# Patient Record
Sex: Female | Born: 1975 | Race: White | Hispanic: No | Marital: Married | Smoking: Former smoker
Health system: Southern US, Community
[De-identification: ages and names within clinical notes are randomized; demographics above are authoritative.]

## PROBLEM LIST (undated history)

## (undated) ENCOUNTER — Inpatient Hospital Stay (HOSPITAL_COMMUNITY): Payer: Self-pay

## (undated) DIAGNOSIS — O09529 Supervision of elderly multigravida, unspecified trimester: Secondary | ICD-10-CM

## (undated) DIAGNOSIS — Z8619 Personal history of other infectious and parasitic diseases: Secondary | ICD-10-CM

## (undated) DIAGNOSIS — IMO0002 Reserved for concepts with insufficient information to code with codable children: Secondary | ICD-10-CM

## (undated) DIAGNOSIS — F419 Anxiety disorder, unspecified: Secondary | ICD-10-CM

## (undated) HISTORY — PX: MOUTH SURGERY: SHX715

## (undated) HISTORY — DX: Supervision of elderly multigravida, unspecified trimester: O09.529

## (undated) HISTORY — DX: Anxiety disorder, unspecified: F41.9

## (undated) HISTORY — DX: Reserved for concepts with insufficient information to code with codable children: IMO0002

## (undated) HISTORY — DX: Personal history of other infectious and parasitic diseases: Z86.19

---

## 2001-10-14 HISTORY — PX: BREAST SURGERY: SHX581

## 2002-01-01 ENCOUNTER — Emergency Department (HOSPITAL_COMMUNITY): Admission: EM | Admit: 2002-01-01 | Discharge: 2002-01-01 | Payer: Self-pay

## 2002-02-09 ENCOUNTER — Encounter: Admission: RE | Admit: 2002-02-09 | Discharge: 2002-02-09 | Payer: Self-pay

## 2003-08-23 ENCOUNTER — Ambulatory Visit (HOSPITAL_COMMUNITY): Admission: RE | Admit: 2003-08-23 | Discharge: 2003-08-23 | Payer: Self-pay | Admitting: *Deleted

## 2003-09-06 ENCOUNTER — Ambulatory Visit (HOSPITAL_COMMUNITY): Admission: RE | Admit: 2003-09-06 | Discharge: 2003-09-06 | Payer: Self-pay | Admitting: *Deleted

## 2003-09-15 ENCOUNTER — Encounter: Admission: RE | Admit: 2003-09-15 | Discharge: 2003-09-15 | Payer: Self-pay | Admitting: *Deleted

## 2003-09-28 ENCOUNTER — Ambulatory Visit (HOSPITAL_COMMUNITY): Admission: RE | Admit: 2003-09-28 | Discharge: 2003-09-28 | Payer: Self-pay | Admitting: Obstetrics and Gynecology

## 2003-09-28 ENCOUNTER — Encounter: Admission: RE | Admit: 2003-09-28 | Discharge: 2003-09-28 | Payer: Self-pay | Admitting: *Deleted

## 2003-10-12 ENCOUNTER — Encounter (INDEPENDENT_AMBULATORY_CARE_PROVIDER_SITE_OTHER): Payer: Self-pay | Admitting: Specialist

## 2003-10-12 ENCOUNTER — Observation Stay (HOSPITAL_COMMUNITY): Admission: AD | Admit: 2003-10-12 | Discharge: 2003-10-13 | Payer: Self-pay | Admitting: *Deleted

## 2004-09-27 IMAGING — US US OB DETAIL+14 WK
1 series · 13 of 17 positions shown · non-contrast
Comparison: none

CLINICAL DATA: 27 year-old female with risk for neural tube defect.  Approximately 16 week gestation.
OBSTETRICAL ULTRASOUND DETAILED WITH ENDOVAGINAL EVALUATION
NUMBER OF FETUSES:  1
HEART RATE:  155 bpm
MOVEMENT:  Yes
BREATHING:  Yes
PRESENTATION:  Breech
PLACENTAL LOCATION:  Anterior, low lying
AMNIOTIC FLUID (SUBJECTIVE):  Normal

[Series 1: unknown · 0.17mm/px · 13 of 17 slices shown]
[im 1/17]
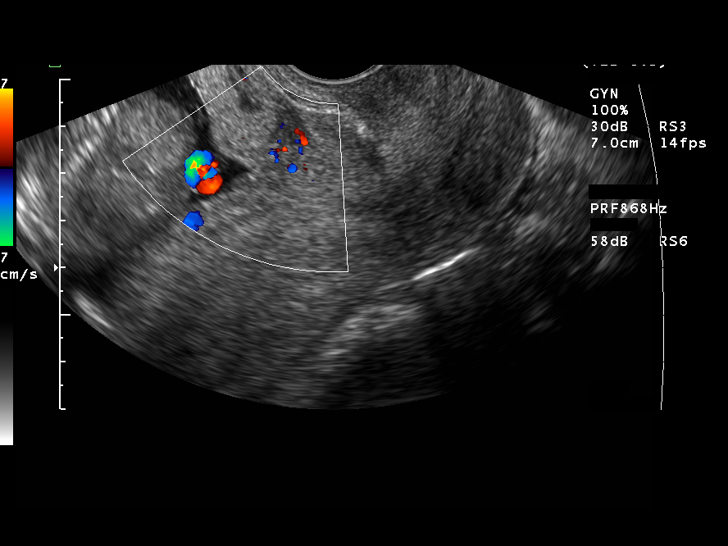
[im 2/17]
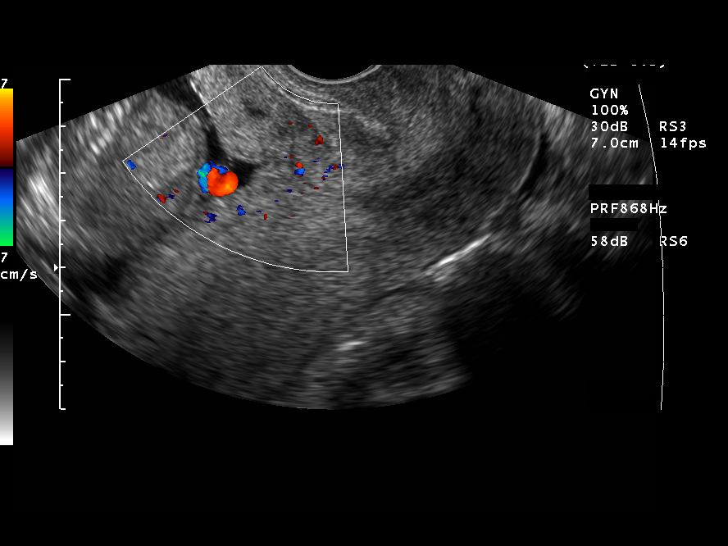
[im 4/17]
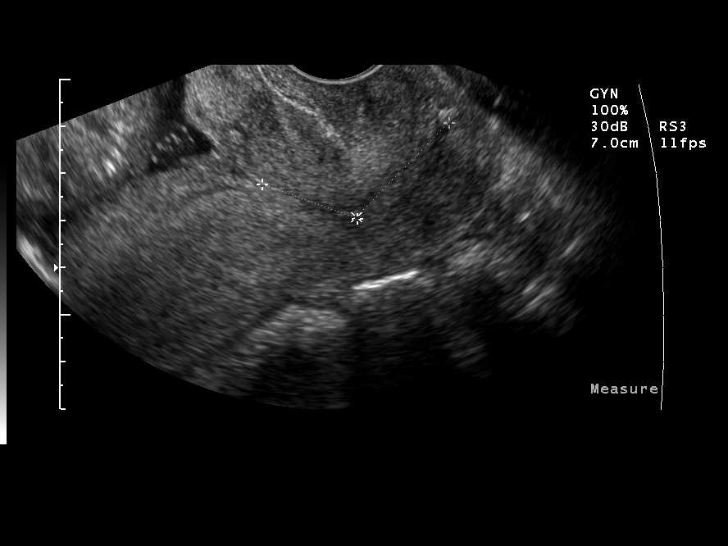
[im 5/17]
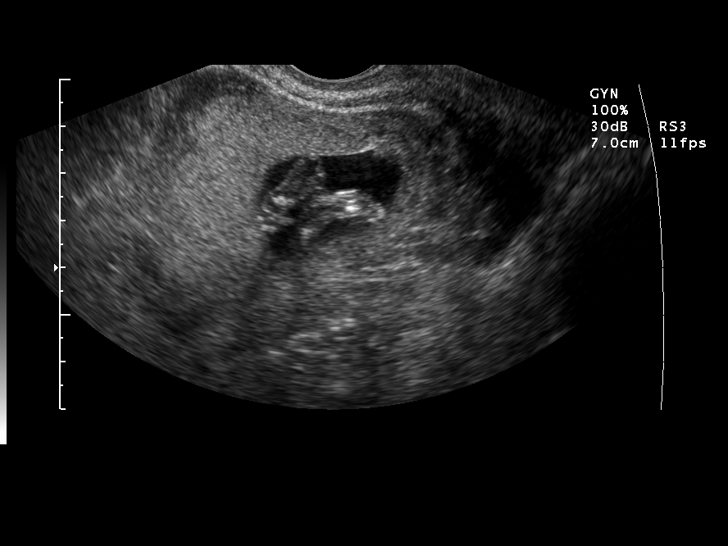
[im 6/17]
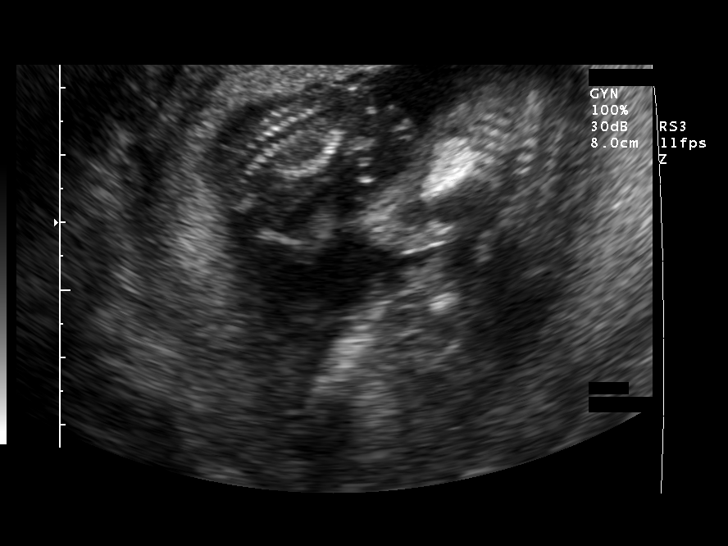
[im 8/17]
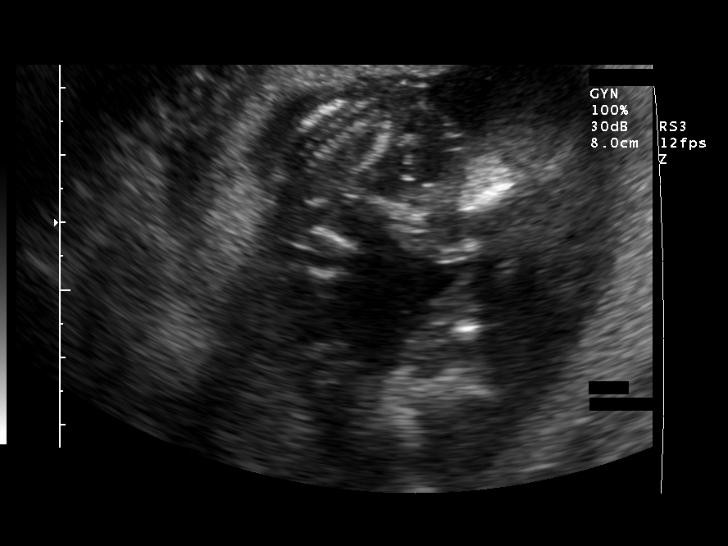
[im 9/17]
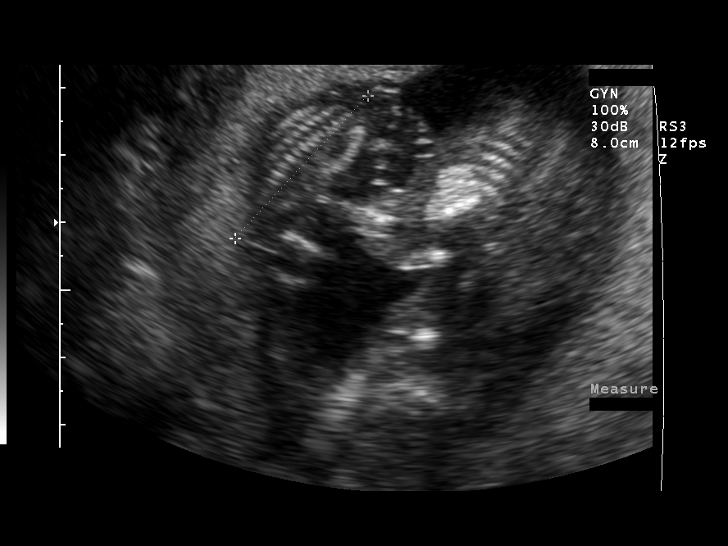
[im 10/17]
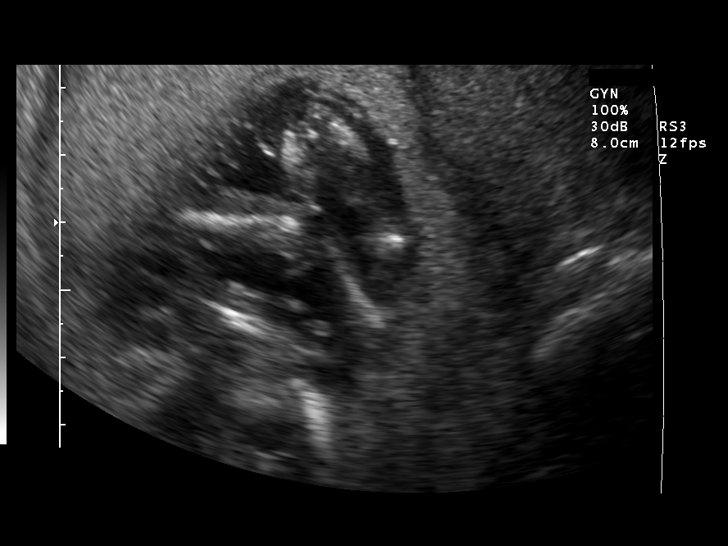
[im 12/17]
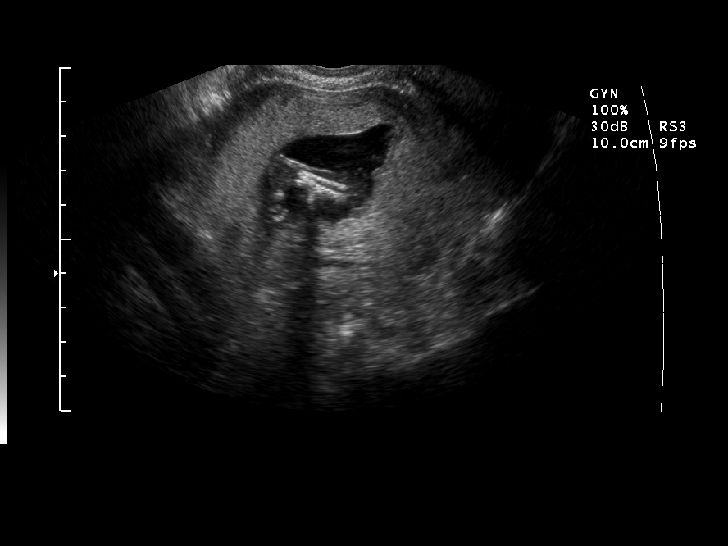
[im 13/17]
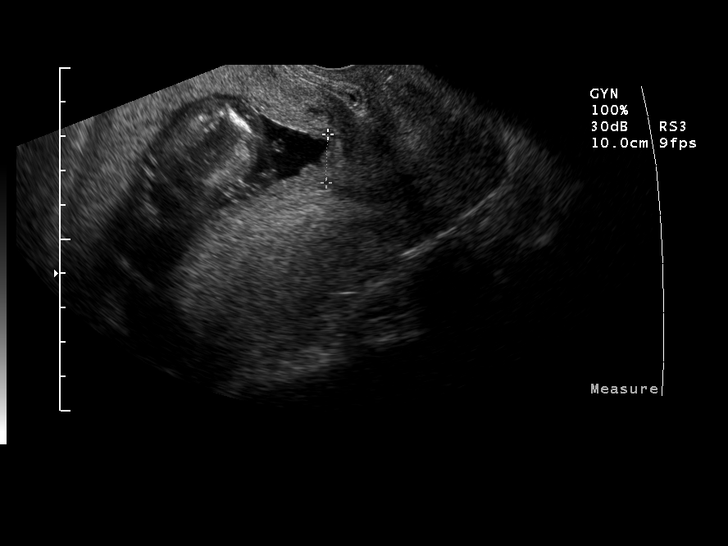
[im 14/17]
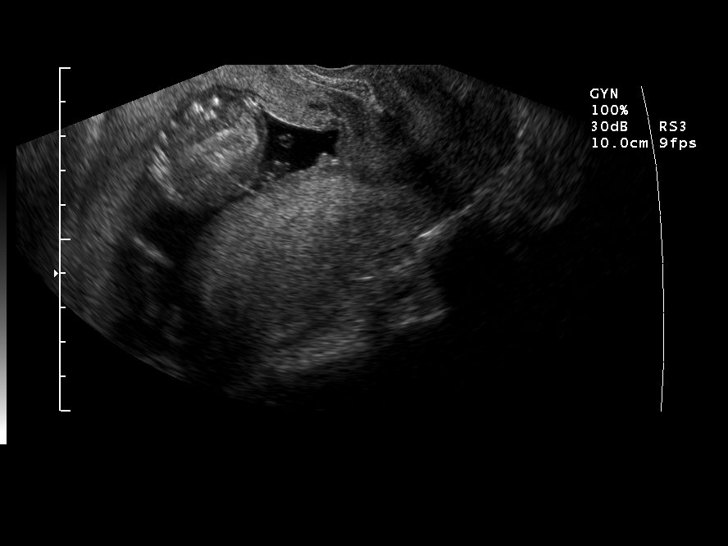
[im 16/17]
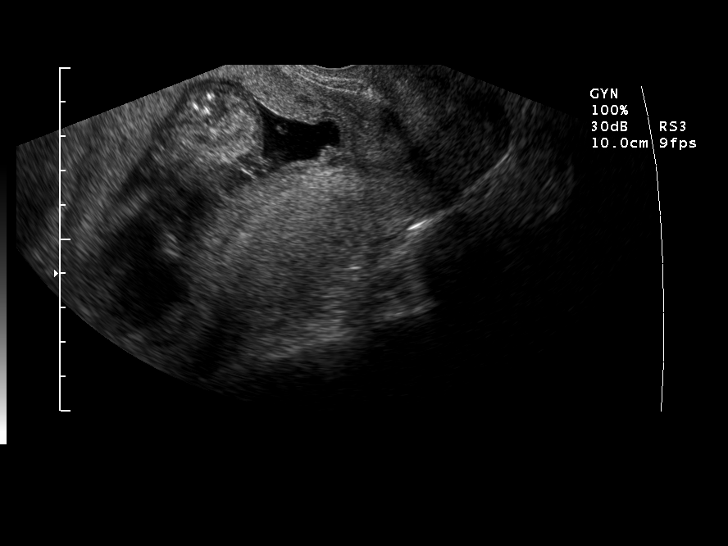
[im 17/17]
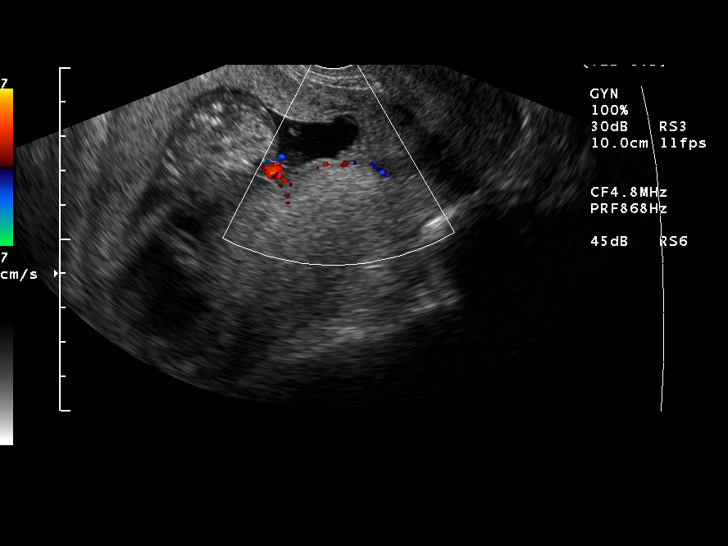

[13 of 17 positions shown; findings below may reference images not displayed]

FETAL BIOMETRY
BPD:  3.0 cm  15W  4D
HC:  11.8 cm  15W  6D
AC:  11.0 cm  16W  6D
FL:  2.0 cm  15W  6D
MEAN GA:  16W  0D

FETAL ANATOMY
LATERAL VENTRICLES:  visualized 
THALAMI/CPS:  visualized 
POSTERIOR FOSSA:  visualized 
NUCHAL REGION:  visualized 
SPINE:  visualized 
4 CHAMBER HEART ON LEFT:  visualized 
STOMACH ON LEFT:  visualized 
3 VESSEL CORD:  visualized 
CORD INSERTION SITE:  visualized 
KIDNEYS:  visualized 
BLADDER:  visualized 
EXTREMITIES:  visualized 

ADDITIONAL ANATOMY VISUALIZED:  LVOT, RVOT, upper lip, ductal arch, aortic arch.

EVALUATION LIMITED BY:  Early gestational age. 

MATERNAL FINDINGS
CERVIX:   3.9 cm transvaginally.
IMPRESSION 
Monoamniotic twin pregnancy with the larger of the two twins having a mean gestational age of 16 weeks 0 days by fetal biometry.  The second, much smaller, twin has an identifiable abdomen and lower extremities with a probable partial lower thorax.  No cardiac activity or movement was identified in the small twin.  No calvarium could be identified in the small fetus.  
Despite concerted effort, Doppler flow in the smaller twin could not be definitely appreciated.  The constellation of findings today is worrisome for acardiac twinning.
There is a question of echogenic bowel in the larger twin, a finding which could not be reliably reproduced.   The remaining visualized anatomy in the larger twin is unremarkable.   These results were discussed with Doctor Henok at the time of the examination.

## 2004-11-16 IMAGING — US US OB LIMITED
1 series · 12 of 12 positions shown · non-contrast
Comparison: none

CLINICAL DATA: Follow-up Monoamniotic twin gestation with acardiac twin.  Decreased fetal movement.

[Series 1: unknown · 0.28mm/px · 12 of 12 slices shown]
[im 1/12]
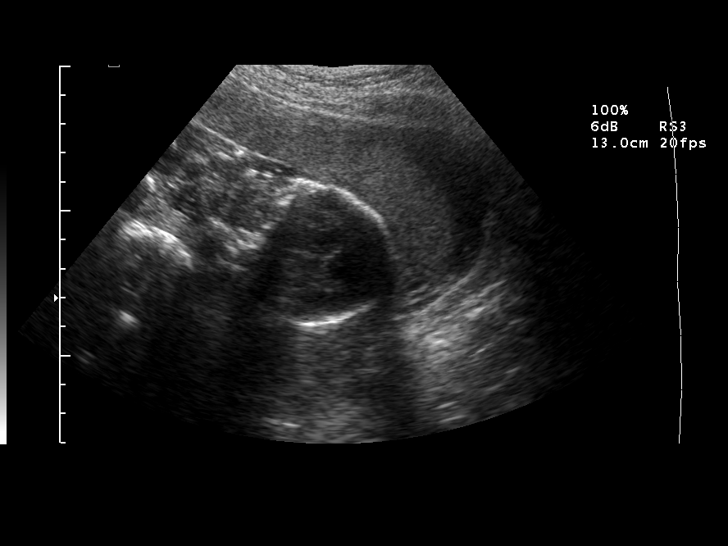
[im 2/12]
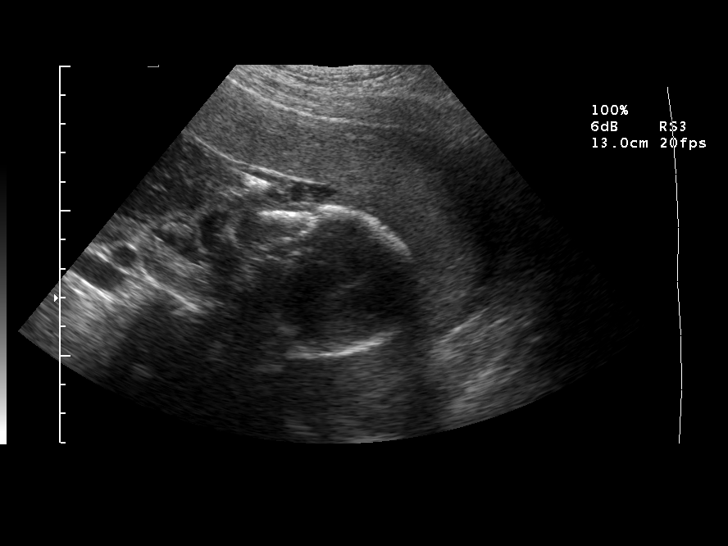
[im 3/12]
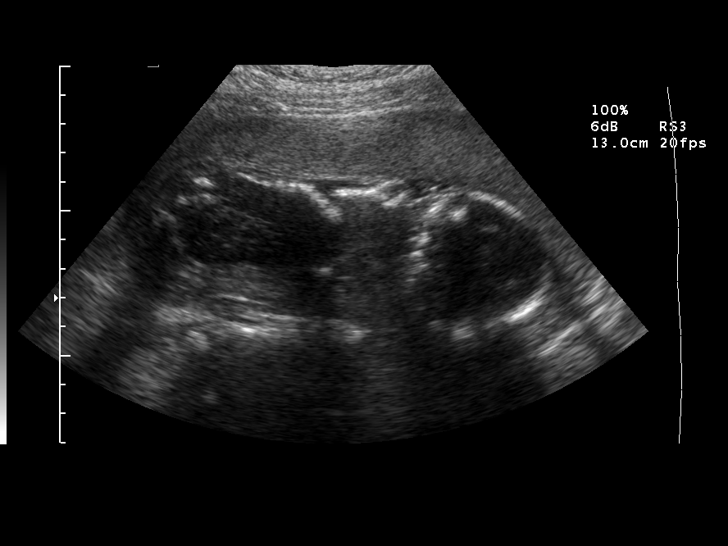
[im 4/12]
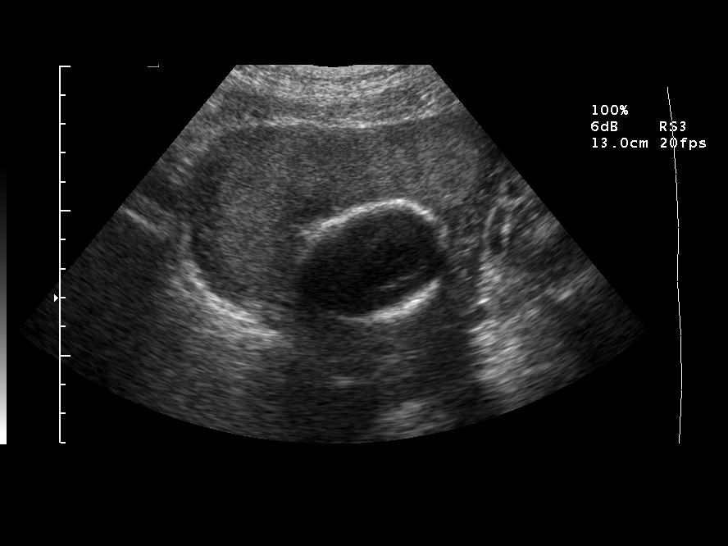
[im 5/12]
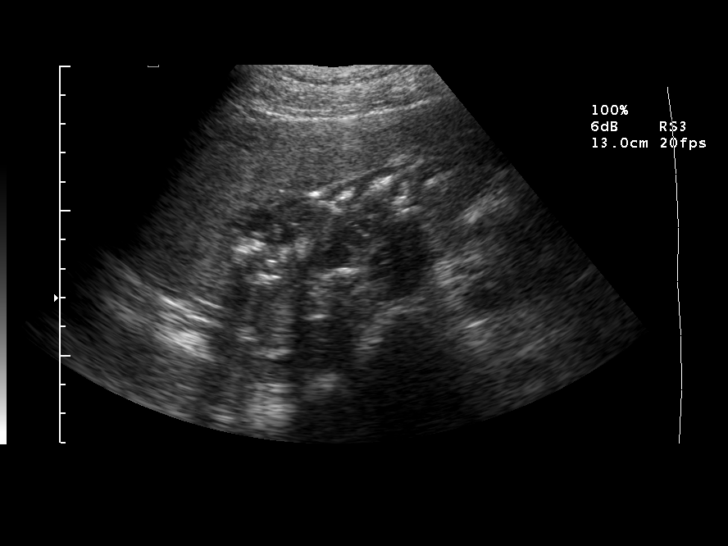
[im 6/12]
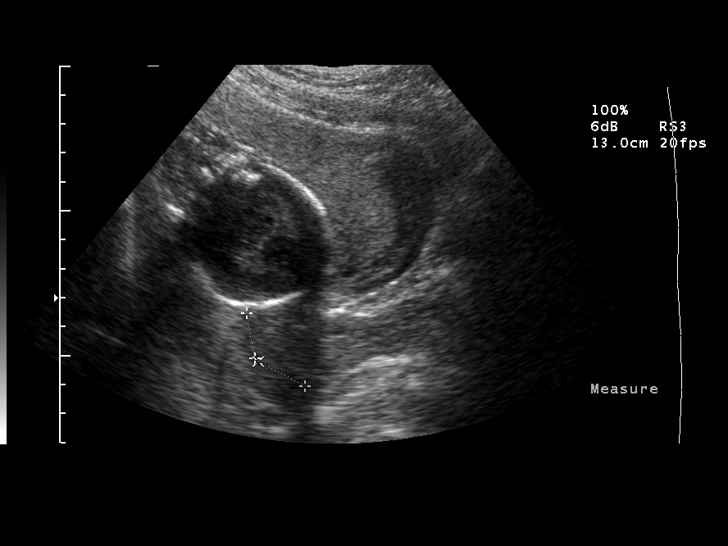
[im 7/12]
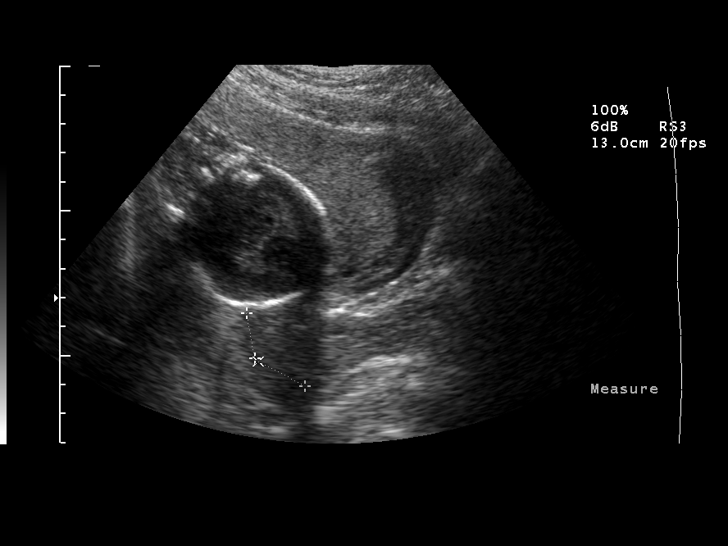
[im 8/12]
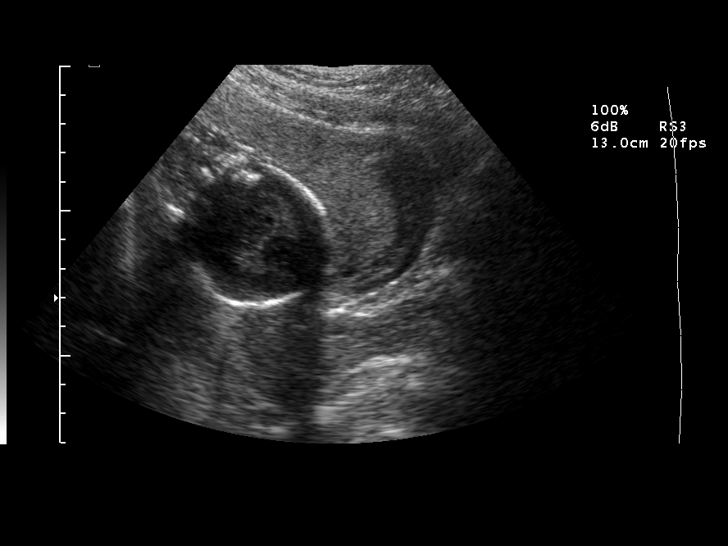
[im 9/12]
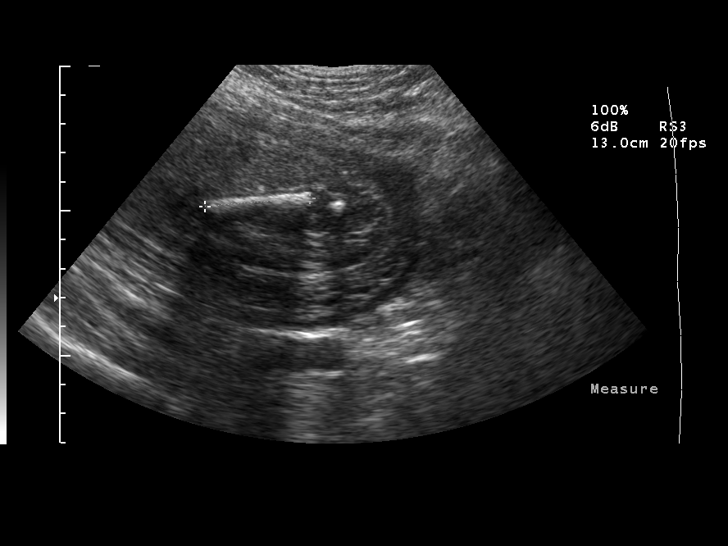
[im 10/12]
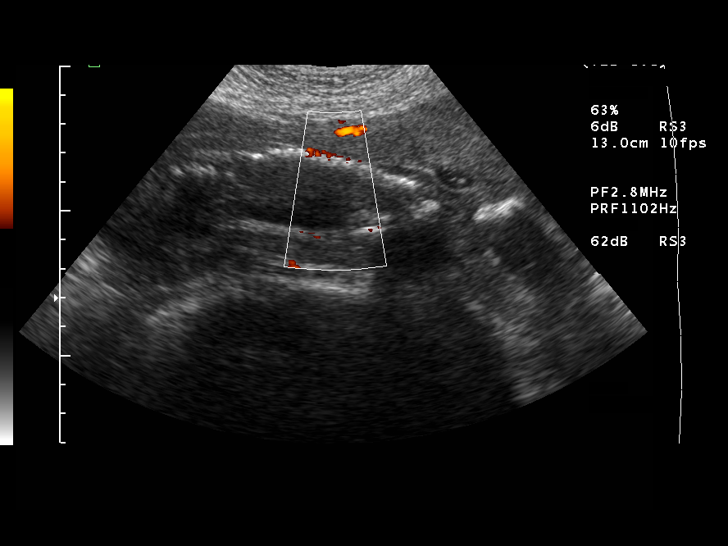
[im 11/12]
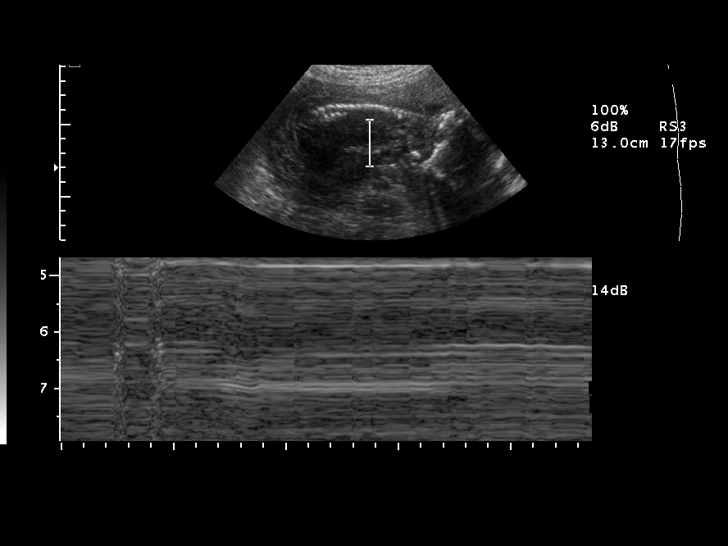
[im 12/12]
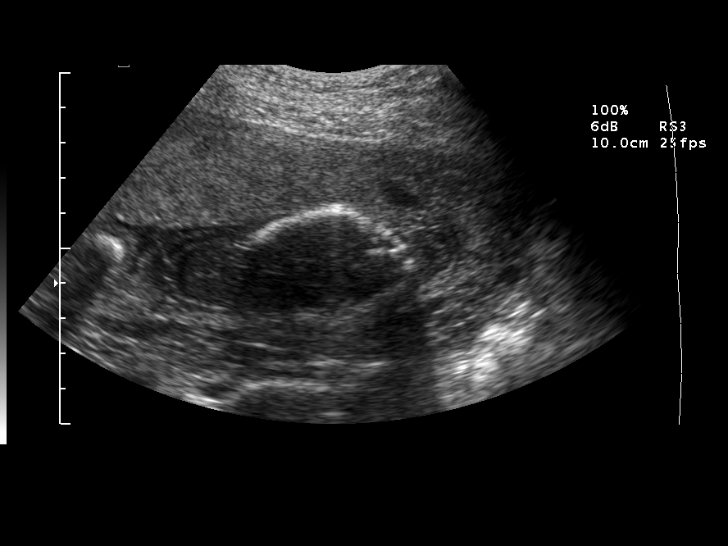

[12 of 12 positions shown; findings below may reference images not displayed]

LIMITED OBSTETRICAL ULTRASOUND
Comparison is made with previous study on 09/28/03.
An intrauterine fetus is seen on today?s study in cephalic presentation.  Femur length measures 3.6 cm, consistent with a gestational age of 21 weeks 4 days.  There is no fetal cardiac activity on this study, consistent with intrauterine fetal demise which has occurred since the prior study on 09/28/03.  No amniotic fluid is present and the previously seen small acardiac twin is not visualized on today?s study.  
IMPRESSION
Intrauterine fetal demise, as described above.  Longeyareeo?Solt study measures 21 weeks 4 days.  These findings were discussed with the patient and Dr. Ramones.

## 2005-02-08 ENCOUNTER — Other Ambulatory Visit: Admission: RE | Admit: 2005-02-08 | Discharge: 2005-02-08 | Payer: Self-pay | Admitting: Obstetrics and Gynecology

## 2005-05-03 ENCOUNTER — Inpatient Hospital Stay (HOSPITAL_COMMUNITY): Admission: AD | Admit: 2005-05-03 | Discharge: 2005-05-03 | Payer: Self-pay | Admitting: Obstetrics and Gynecology

## 2005-09-02 ENCOUNTER — Inpatient Hospital Stay (HOSPITAL_COMMUNITY): Admission: AD | Admit: 2005-09-02 | Discharge: 2005-09-02 | Payer: Self-pay | Admitting: Obstetrics and Gynecology

## 2005-09-03 ENCOUNTER — Inpatient Hospital Stay (HOSPITAL_COMMUNITY): Admission: AD | Admit: 2005-09-03 | Discharge: 2005-09-05 | Payer: Self-pay | Admitting: Obstetrics and Gynecology

## 2005-10-03 ENCOUNTER — Other Ambulatory Visit: Admission: RE | Admit: 2005-10-03 | Discharge: 2005-10-03 | Payer: Self-pay | Admitting: Obstetrics and Gynecology

## 2006-06-08 IMAGING — US US OB LIMITED
1 series · 14 of 28 positions shown · non-contrast
Comparison: none

05/08/05 ? DUPLICATE COPY for exam association in [facility] ? No change from original report.
CLINICAL DATA: 22 weeks pregnant, low amniotic fluid volume, question premature rupture of membranes.

[Series 1: us ob limited · 0.24mm/px · 14 of 36 slices shown]
[im 2/36]
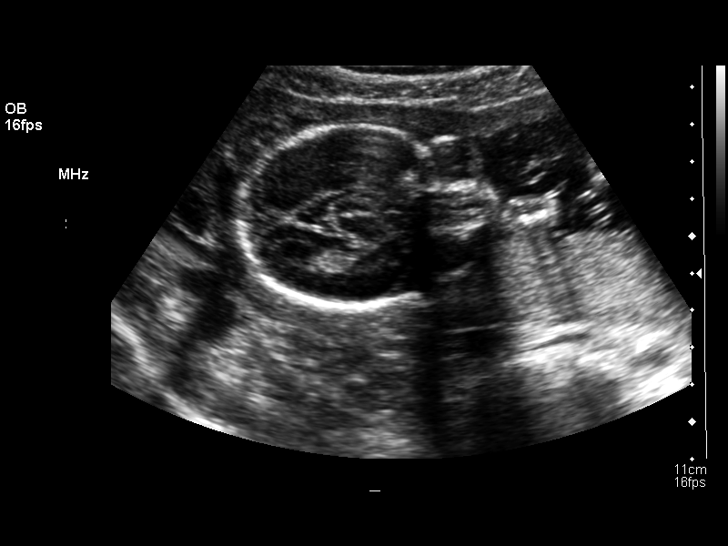
[im 4/36]
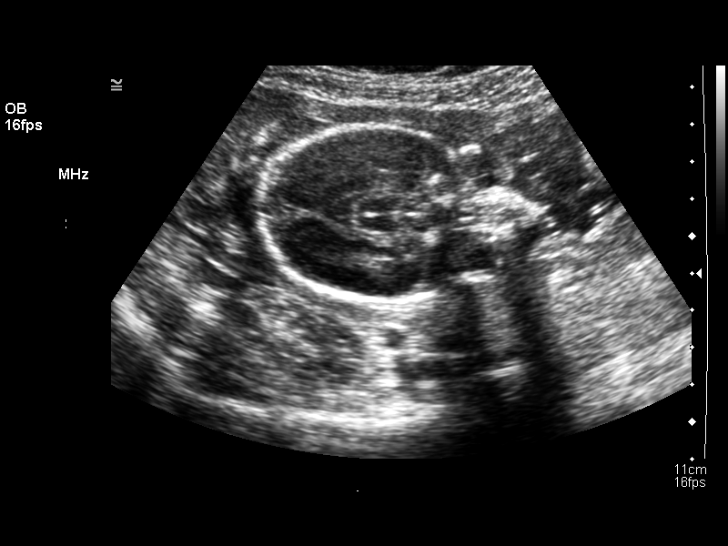
[im 7/36]
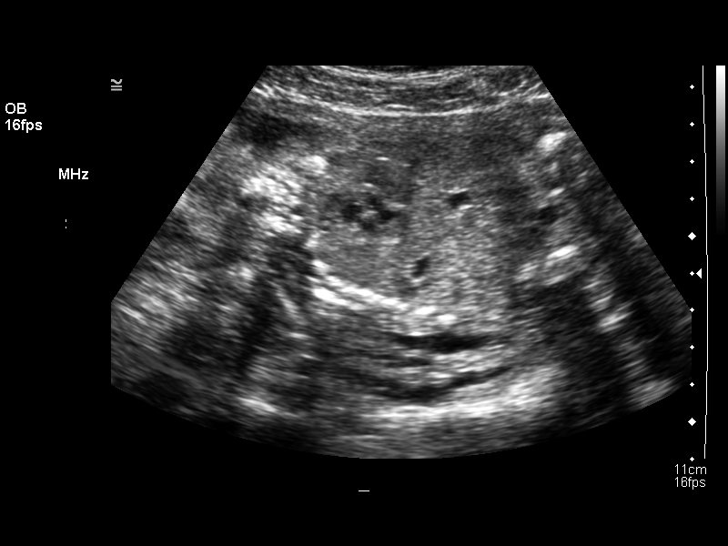
[im 10/36]
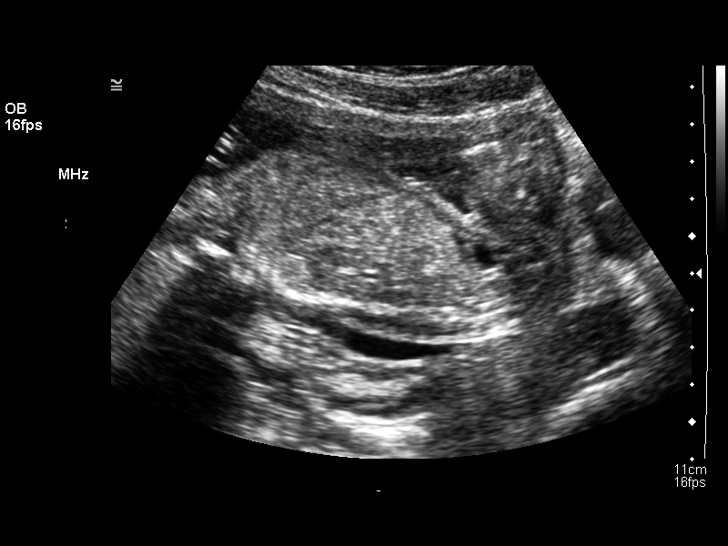
[im 12/36]
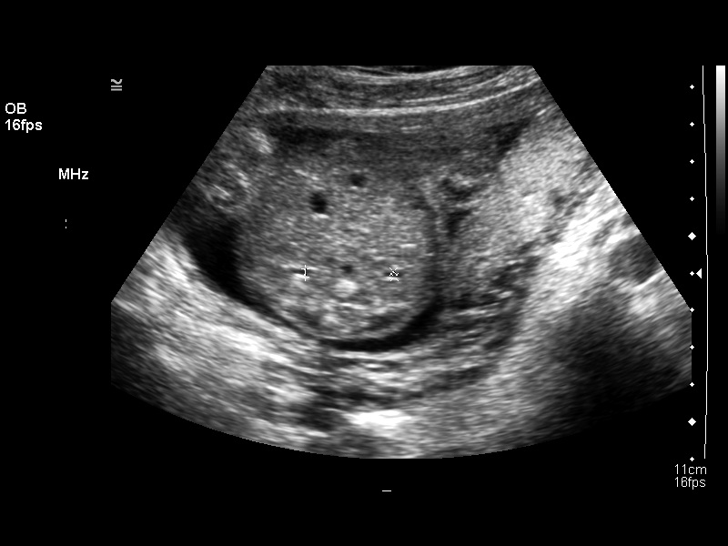
[im 15/36]
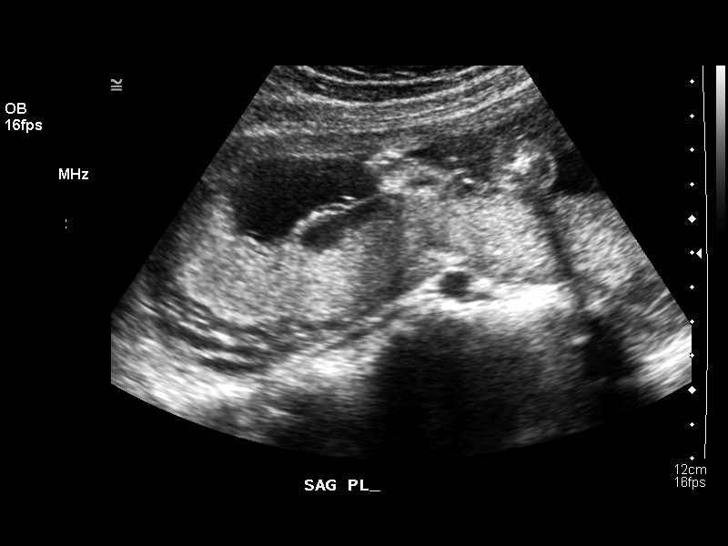
[im 17/36]
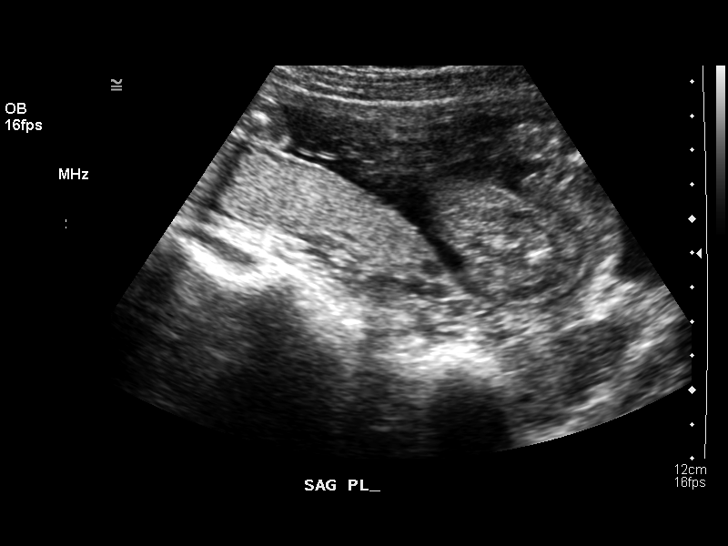
[im 20/36]
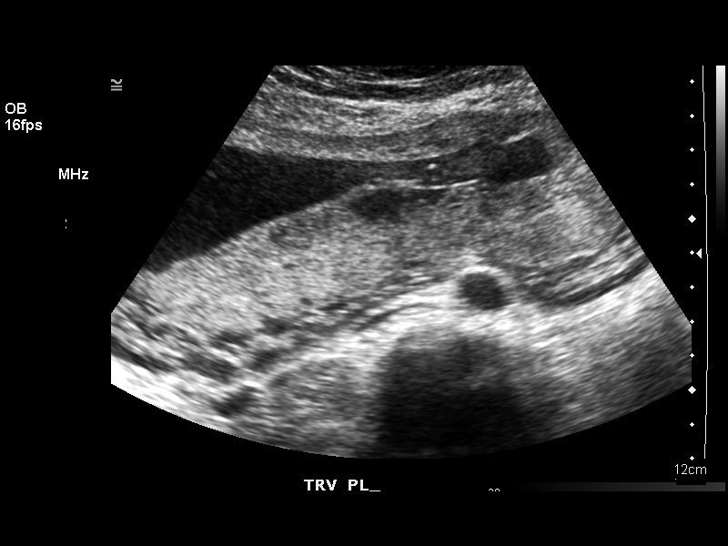
[im 23/36]
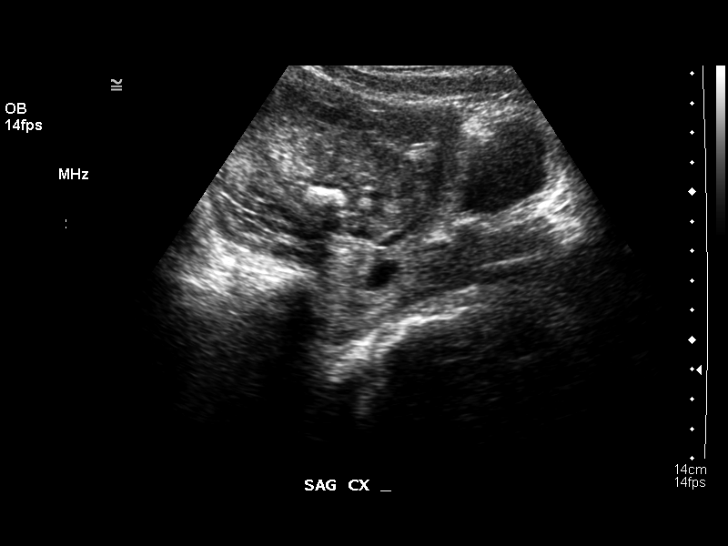
[im 25/36]
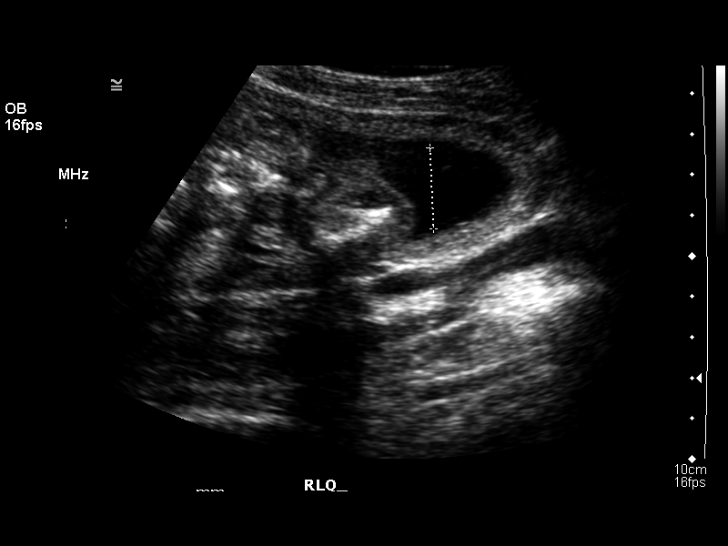
[im 28/36]
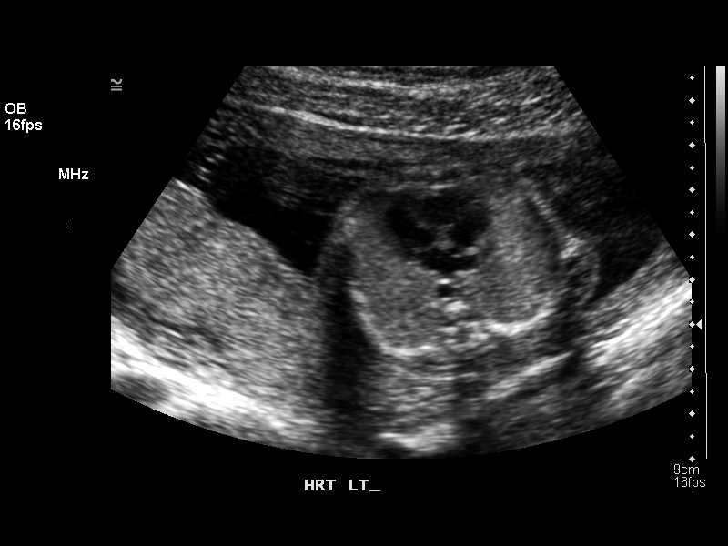
[im 30/36]
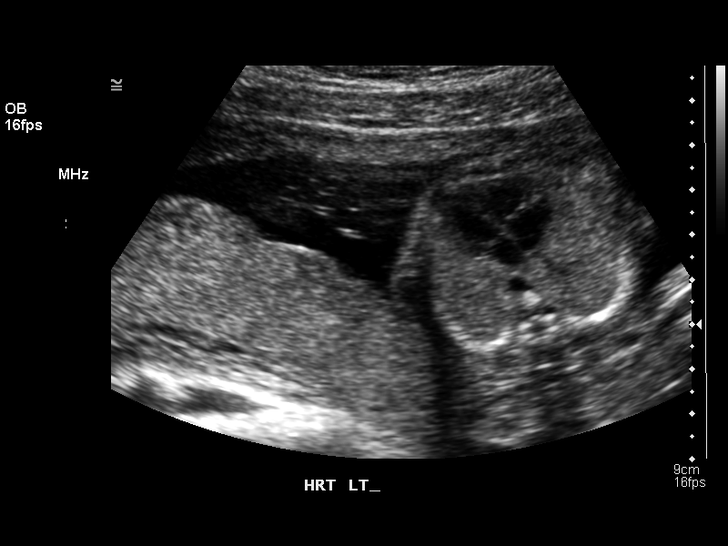
[im 33/36]
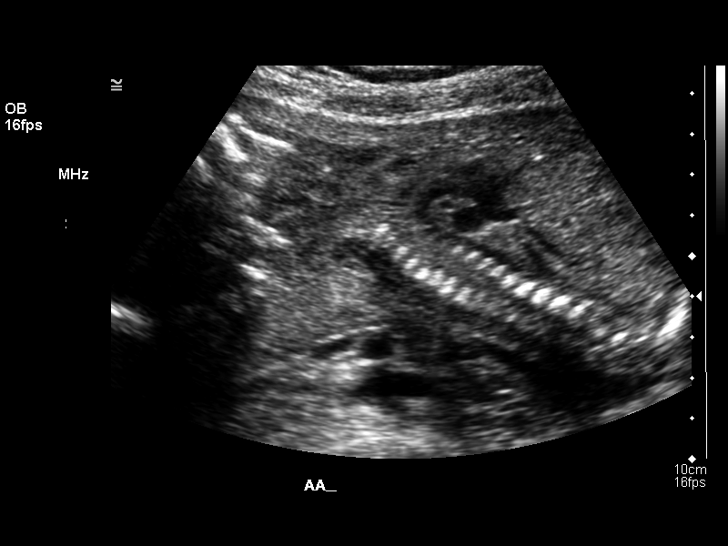
[im 36/36]
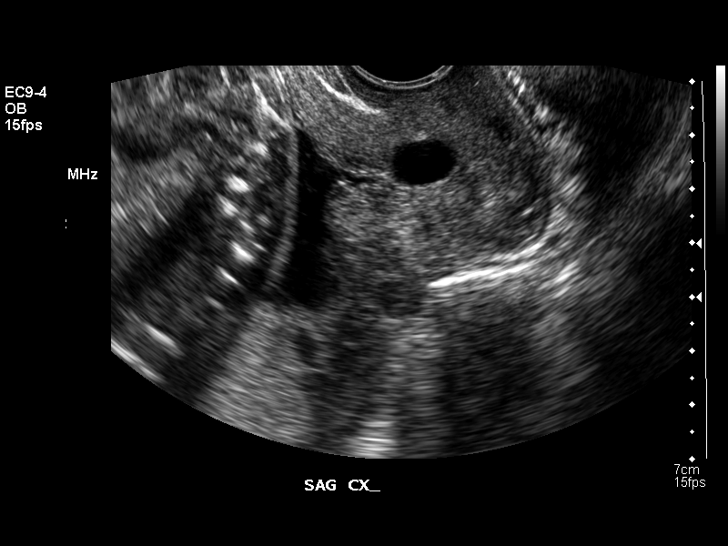

[14 of 28 positions shown; findings below may reference images not displayed]

LIMITED OBSTETRICAL ULTRASOUND:
 Number of Fetuses:  1
 Heart Rate:  147 bpm
 Movement:  Yes 
 Breathing:  No 
 Presentation:  Breech
 Placental Location:  Posterior 
 Grade:  1
 Previa:  No 
 Amniotic Fluid (Subjective):  Low normal
 Amniotic Fluid (Objective):  Vertical pocket 3.1 cm 

 Fetal measurements and complete anatomic evaluation were not requested.  The following fetal anatomy was visualized during this exam:  Lateral ventricles, thalami, four chamber heart, stomach on the left, kidneys, bladder, orbits, profile, diaphragm, aortic arch

 MATERNAL UTERINE AND ADNEXAL FINDINGS
 Cervix:  3.7 cm transvaginally.  Nabothian cyst noted at cervix.
IMPRESSION: 1.  Single live intrauterine gestation in breech presentation.

 2.  Limited morphology due to fetal position and age.  

 3.  No morphologic abnormality seen through survey is incomplete.  

 4.  Subjectively amniotic fluid volume is low normal with greatest pocket 3.1 cm.  No placenta previa.

## 2007-02-06 ENCOUNTER — Inpatient Hospital Stay (HOSPITAL_COMMUNITY): Admission: AD | Admit: 2007-02-06 | Discharge: 2007-02-08 | Payer: Self-pay | Admitting: Obstetrics and Gynecology

## 2007-12-12 ENCOUNTER — Emergency Department (HOSPITAL_COMMUNITY): Admission: EM | Admit: 2007-12-12 | Discharge: 2007-12-13 | Payer: Self-pay | Admitting: Emergency Medicine

## 2010-11-03 ENCOUNTER — Encounter: Payer: Self-pay | Admitting: *Deleted

## 2010-11-04 ENCOUNTER — Encounter: Payer: Self-pay | Admitting: *Deleted

## 2011-07-05 LAB — BASIC METABOLIC PANEL
BUN: 11
CO2: 24
Calcium: 8.8
Chloride: 101
Creatinine, Ser: 0.77
GFR calc Af Amer: >60
GFR calc non Af Amer: >60
Glucose, Bld: 137 — ABNORMAL HIGH
Potassium: 4
Sodium: 133 — ABNORMAL LOW

## 2011-07-05 LAB — DIFFERENTIAL
Basophils Absolute: 0
Basophils Relative: 0
Eosinophils Absolute: 0
Eosinophils Relative: 0
Lymphocytes Relative: 7 — ABNORMAL LOW
Lymphs Abs: 1
Monocytes Absolute: 0.6
Monocytes Relative: 4
Neutro Abs: 12.9 — ABNORMAL HIGH
Neutrophils Relative %: 88 — ABNORMAL HIGH

## 2011-07-05 LAB — CBC
HCT: 29.1 — ABNORMAL LOW
Hemoglobin: 9.9 — ABNORMAL LOW
MCHC: 34.2
MCV: 87.1
Platelets: 241
RBC: 3.35 — ABNORMAL LOW
RDW: 13.9
WBC: 14.7 — ABNORMAL HIGH

## 2011-07-05 LAB — SAMPLE TO BLOOD BANK

## 2011-07-05 LAB — HCG, QUANTITATIVE, PREGNANCY: hCG, Beta Chain, Quant, S: 7699 — ABNORMAL HIGH

## 2011-07-05 LAB — ABO/RH: ABO/RH(D): O POS

## 2011-07-22 LAB — OB RESULTS CONSOLE HIV ANTIBODY (ROUTINE TESTING): HIV: NONREACTIVE

## 2011-07-22 LAB — OB RESULTS CONSOLE GC/CHLAMYDIA
Chlamydia: NEGATIVE
Gonorrhea: NEGATIVE

## 2011-07-22 LAB — OB RESULTS CONSOLE GBS: GBS: POSITIVE

## 2011-07-22 LAB — OB RESULTS CONSOLE ABO/RH: RH Type: POSITIVE

## 2011-07-22 LAB — OB RESULTS CONSOLE ANTIBODY SCREEN: Antibody Screen: NEGATIVE

## 2011-07-22 LAB — OB RESULTS CONSOLE RPR: RPR: NONREACTIVE

## 2011-10-15 NOTE — L&D Delivery Note (Signed)
Delivery Note At 6:33 AM a viable female was delivered via Vaginal, Spontaneous Delivery (Presentation: ; Occiput Posterior).  APGAR: 8, 9; weight .   Placenta status: , Expressed.  Cord: 3 vessels with the following complications: None.  Cord pH: na  Anesthesia: None  Episiotomy: None Lacerations: 2nd degree Suture Repair: 2.0 chromic Est. Blood Loss (mL): 400  Mom to postpartum.  Baby to nursery-stable.  Samiha Denapoli S 02/15/2012, 6:54 AM

## 2012-02-13 ENCOUNTER — Telehealth (HOSPITAL_COMMUNITY): Payer: Self-pay | Admitting: *Deleted

## 2012-02-13 ENCOUNTER — Encounter (HOSPITAL_COMMUNITY): Payer: Self-pay | Admitting: *Deleted

## 2012-02-13 NOTE — Telephone Encounter (Signed)
Preadmission screen  

## 2012-02-14 ENCOUNTER — Encounter (HOSPITAL_COMMUNITY): Payer: Self-pay | Admitting: *Deleted

## 2012-02-14 ENCOUNTER — Telehealth (HOSPITAL_COMMUNITY): Payer: Self-pay | Admitting: *Deleted

## 2012-02-14 NOTE — Telephone Encounter (Signed)
Preadmission screen  

## 2012-02-15 ENCOUNTER — Encounter (HOSPITAL_COMMUNITY): Payer: Self-pay

## 2012-02-15 ENCOUNTER — Inpatient Hospital Stay (HOSPITAL_COMMUNITY)
Admission: AD | Admit: 2012-02-15 | Discharge: 2012-02-17 | DRG: 775 | Disposition: A | Discharge status: Home / Self Care | Source: Ambulatory Visit | Attending: Obstetrics and Gynecology | Admitting: Obstetrics and Gynecology

## 2012-02-15 DIAGNOSIS — Z22330 Carrier of Group B streptococcus: Secondary | ICD-10-CM

## 2012-02-15 DIAGNOSIS — O99892 Other specified diseases and conditions complicating childbirth: Secondary | ICD-10-CM | POA: Diagnosis present

## 2012-02-15 DIAGNOSIS — O09529 Supervision of elderly multigravida, unspecified trimester: Secondary | ICD-10-CM | POA: Diagnosis present

## 2012-02-15 LAB — CBC
HCT: 35.1 % — ABNORMAL LOW (ref 36.0–46.0)
Hemoglobin: 11.2 g/dL — ABNORMAL LOW (ref 12.0–15.0)
MCH: 27.5 pg (ref 26.0–34.0)
MCHC: 31.9 g/dL (ref 30.0–36.0)
MCV: 86.2 fL (ref 78.0–100.0)
RDW: 15.4 % (ref 11.5–15.5)

## 2012-02-15 MED ORDER — EPHEDRINE 5 MG/ML INJ: 10.0000 mg | INTRAVENOUS | Status: DC | PRN

## 2012-02-15 MED ORDER — PRENATAL MULTIVITAMIN CH
1.0000 | ORAL_TABLET | Freq: Every day | ORAL | Status: DC
  Administered 2012-02-15: 1 via ORAL
  Filled 2012-02-15: qty 1

## 2012-02-15 MED ORDER — WITCH HAZEL-GLYCERIN EX PADS: 1.0000 "application " | MEDICATED_PAD | CUTANEOUS | Status: DC | PRN

## 2012-02-15 MED ORDER — FENTANYL 2.5 MCG/ML BUPIVACAINE 1/10 % EPIDURAL INFUSION (WH - ANES): 14.0000 mL/h | INTRAMUSCULAR | Status: DC

## 2012-02-15 MED ORDER — PHENYLEPHRINE 40 MCG/ML (10ML) SYRINGE FOR IV PUSH (FOR BLOOD PRESSURE SUPPORT): 80.0000 ug | PREFILLED_SYRINGE | INTRAVENOUS | Status: DC | PRN

## 2012-02-15 MED ORDER — CLINDAMYCIN PHOSPHATE 900 MG/50ML IV SOLN: 900.0000 mg | Freq: Three times a day (TID) | INTRAVENOUS | Status: DC

## 2012-02-15 MED ORDER — DIBUCAINE 1 % RE OINT: 1.0000 "application " | TOPICAL_OINTMENT | RECTAL | Status: DC | PRN

## 2012-02-15 MED ORDER — LACTATED RINGERS IV SOLN: 500.0000 mL | INTRAVENOUS | Status: DC | PRN

## 2012-02-15 MED ORDER — CITRIC ACID-SODIUM CITRATE 334-500 MG/5ML PO SOLN: 30.0000 mL | ORAL | Status: DC | PRN

## 2012-02-15 MED ORDER — ONDANSETRON HCL 4 MG/2ML IJ SOLN: 4.0000 mg | INTRAMUSCULAR | Status: DC | PRN

## 2012-02-15 MED ORDER — LANOLIN HYDROUS EX OINT: TOPICAL_OINTMENT | CUTANEOUS | Status: DC | PRN

## 2012-02-15 MED ORDER — BISACODYL 10 MG RE SUPP: 10.0000 mg | Freq: Every day | RECTAL | Status: DC | PRN

## 2012-02-15 MED ORDER — METHYLERGONOVINE MALEATE 0.2 MG/ML IJ SOLN
INTRAMUSCULAR | Status: AC
  Filled 2012-02-15: qty 1

## 2012-02-15 MED ORDER — SIMETHICONE 80 MG PO CHEW: 80.0000 mg | CHEWABLE_TABLET | ORAL | Status: DC | PRN

## 2012-02-15 MED ORDER — ONDANSETRON HCL 4 MG/2ML IJ SOLN: 4.0000 mg | Freq: Four times a day (QID) | INTRAMUSCULAR | Status: DC | PRN

## 2012-02-15 MED ORDER — FLEET ENEMA 7-19 GM/118ML RE ENEM: 1.0000 | ENEMA | Freq: Every day | RECTAL | Status: DC | PRN

## 2012-02-15 MED ORDER — BENZOCAINE-MENTHOL 20-0.5 % EX AERO
1.0000 "application " | INHALATION_SPRAY | CUTANEOUS | Status: DC | PRN
  Administered 2012-02-15: 1 via TOPICAL
  Filled 2012-02-15: qty 56

## 2012-02-15 MED ORDER — TETANUS-DIPHTH-ACELL PERTUSSIS 5-2.5-18.5 LF-MCG/0.5 IM SUSP: 0.5000 mL | Freq: Once | INTRAMUSCULAR | Status: DC

## 2012-02-15 MED ORDER — BETAMETHASONE VALERATE 0.1 % EX CREA
TOPICAL_CREAM | Freq: Two times a day (BID) | CUTANEOUS | Status: DC
  Administered 2012-02-15 – 2012-02-16 (×4): via TOPICAL

## 2012-02-15 MED ORDER — DIPHENHYDRAMINE HCL 25 MG PO CAPS
25.0000 mg | ORAL_CAPSULE | Freq: Four times a day (QID) | ORAL | Status: DC | PRN
  Administered 2012-02-16: 25 mg via ORAL
  Filled 2012-02-15: qty 1

## 2012-02-15 MED ORDER — LACTATED RINGERS IV SOLN
INTRAVENOUS | Status: DC
  Administered 2012-02-15 (×2): via INTRAVENOUS

## 2012-02-15 MED ORDER — OXYTOCIN 20 UNITS IN LACTATED RINGERS INFUSION - SIMPLE
125.0000 mL/h | Freq: Once | INTRAVENOUS | Status: AC
  Administered 2012-02-15: 125 mL/h via INTRAVENOUS

## 2012-02-15 MED ORDER — DIPHENHYDRAMINE HCL 25 MG PO CAPS
25.0000 mg | ORAL_CAPSULE | Freq: Four times a day (QID) | ORAL | Status: DC | PRN
  Administered 2012-02-15: 25 mg via ORAL
  Filled 2012-02-15: qty 1

## 2012-02-15 MED ORDER — METHYLERGONOVINE MALEATE 0.2 MG/ML IJ SOLN
0.2000 mg | Freq: Once | INTRAMUSCULAR | Status: AC
  Administered 2012-02-15: 0.2 mg via INTRAMUSCULAR

## 2012-02-15 MED ORDER — ZOLPIDEM TARTRATE 5 MG PO TABS: 5.0000 mg | ORAL_TABLET | Freq: Every evening | ORAL | Status: DC | PRN

## 2012-02-15 MED ORDER — LIDOCAINE HCL (PF) 1 % IJ SOLN
30.0000 mL | INTRAMUSCULAR | Status: DC | PRN
  Filled 2012-02-15: qty 30

## 2012-02-15 MED ORDER — DIPHENHYDRAMINE HCL 50 MG/ML IJ SOLN: 12.5000 mg | INTRAMUSCULAR | Status: DC | PRN

## 2012-02-15 MED ORDER — OXYCODONE-ACETAMINOPHEN 5-325 MG PO TABS: 1.0000 | ORAL_TABLET | ORAL | Status: DC | PRN

## 2012-02-15 MED ORDER — FLEET ENEMA 7-19 GM/118ML RE ENEM: 1.0000 | ENEMA | RECTAL | Status: DC | PRN

## 2012-02-15 MED ORDER — OXYTOCIN BOLUS FROM INFUSION
500.0000 mL | Freq: Once | INTRAVENOUS | Status: DC
  Filled 2012-02-15: qty 1000
  Filled 2012-02-15: qty 500

## 2012-02-15 MED ORDER — SENNOSIDES-DOCUSATE SODIUM 8.6-50 MG PO TABS
2.0000 | ORAL_TABLET | Freq: Every day | ORAL | Status: DC
  Administered 2012-02-15 – 2012-02-16 (×2): 2 via ORAL

## 2012-02-15 MED ORDER — VANCOMYCIN HCL IN DEXTROSE 1-5 GM/200ML-% IV SOLN
1000.0000 mg | Freq: Two times a day (BID) | INTRAVENOUS | Status: DC
  Filled 2012-02-15: qty 200

## 2012-02-15 MED ORDER — OXYTOCIN 10 UNIT/ML IJ SOLN: 10.0000 [IU] | Freq: Once | INTRAMUSCULAR | Status: DC

## 2012-02-15 MED ORDER — BUTORPHANOL TARTRATE 2 MG/ML IJ SOLN
INTRAMUSCULAR | Status: AC
  Filled 2012-02-15: qty 1

## 2012-02-15 MED ORDER — ONDANSETRON HCL 4 MG PO TABS: 4.0000 mg | ORAL_TABLET | ORAL | Status: DC | PRN

## 2012-02-15 MED ORDER — IBUPROFEN 600 MG PO TABS
600.0000 mg | ORAL_TABLET | Freq: Four times a day (QID) | ORAL | Status: DC | PRN
  Administered 2012-02-15: 600 mg via ORAL
  Filled 2012-02-15: qty 1

## 2012-02-15 MED ORDER — LACTATED RINGERS IV SOLN: 500.0000 mL | Freq: Once | INTRAVENOUS | Status: DC

## 2012-02-15 MED ORDER — ACETAMINOPHEN 325 MG PO TABS: 650.0000 mg | ORAL_TABLET | ORAL | Status: DC | PRN

## 2012-02-15 MED ORDER — IBUPROFEN 600 MG PO TABS
600.0000 mg | ORAL_TABLET | Freq: Four times a day (QID) | ORAL | Status: DC
  Administered 2012-02-15 – 2012-02-17 (×8): 600 mg via ORAL
  Filled 2012-02-15 (×8): qty 1

## 2012-02-15 MED ORDER — BUTORPHANOL TARTRATE 2 MG/ML IJ SOLN
1.0000 mg | Freq: Once | INTRAMUSCULAR | Status: AC
  Administered 2012-02-15: 1 mg via INTRAVENOUS

## 2012-02-15 MED ORDER — OXYTOCIN 10 UNIT/ML IJ SOLN
INTRAMUSCULAR | Status: AC
  Filled 2012-02-15: qty 1

## 2012-02-15 MED ORDER — OXYCODONE-ACETAMINOPHEN 5-325 MG PO TABS
1.0000 | ORAL_TABLET | ORAL | Status: DC | PRN
  Administered 2012-02-15 – 2012-02-17 (×6): 1 via ORAL
  Filled 2012-02-15 (×6): qty 1

## 2012-02-15 NOTE — Progress Notes (Signed)
Dr Arelia Sneddon notified of patient, history, +gbs, allergy to pcn and zithromax, tracing, ctx pattern and sve result. Order to recheck patient in an hour and call with update.

## 2012-02-15 NOTE — Progress Notes (Signed)
PSYCHOSOCIAL ASSESSMENT ~ MATERNAL/CHILD Name:  Frances Todd        Age: 36     Referral Date: 02/15/2012   Reason/Source:Hx of Anxiety/CN I. FAMILY/HOME ENVIRONMENT A. Child's Legal Guardian Parent(s)    Name: Frances Todd and Frances Todd   Address: [address], [place], [place] [address]  B. Other Household Members/Support Persons   Brothers Frances Todd and Frances Todd ages 6 and 5 C.   Other Support:  Extended family and friends II. PSYCHOSOCIAL DATA A. 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 Cultural and Environment Information/Cultural Issues Impacting Care: N/A III. STRENGTHS X Supportive family/friends   X Adequate Resources  X Home prepared for Child (including basic supplies)                  IV. RISK FACTORS AND CURRENT PROBLEMS        History of Anxiety             V. SOCIAL WORK ASSESSMENT Met with [name] for session.  [name] was resting on the nearby couch during most of the visit.  Mom was holding her baby skin-to-skin during the visit.  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  [name] has limited time off, and plans to take a few days off intermittently.  [name] is keeping her 39 and 100 year old son's while she recovers in the hospital.  The boys have seen their baby brother and they are very excited.  [name] is hoping for some big brother helpers when the family discharges.  [name] lives in [place] and is planning to come and visit soon.  Family has been using [facility] as their children's Primary Care medical home since their first son was born and they plan to continue.  [name] reports managing her anxiety well.  She was on Venlafaxine (Effexor) with the support of her OB up until about one month ago.  She has a current prescription and plans to resume taking her medication.  She reports good coping and supports.  Parents did not  have any concerns at this time.  They look forward to their newborn coming home and joining their growing family.   VI. SOCIAL WORK PLAN X No Further Intervention Required/No Barriers to Discharge  Frances Todd, MSW, LCSW 02/15/2012, 4:13 pm

## 2012-02-15 NOTE — H&P (Signed)
Frances Todd is a 36 y.o. female presenting at 39+ with SOL. Postive GBS>  PHC uncomplicated Maternal Medical History:  Reason for admission: Reason for admission: contractions.  Contractions: Onset was 1-2 hours ago.   Frequency: regular.   Perceived severity is strong.    Fetal activity: Perceived fetal activity is normal.    Prenatal Complications - Diabetes: none.    OB History    Grav Para Term Preterm Abortions TAB SAB Ect Mult Living   6 3 2 1 2 1 1   2      Past Medical History  Diagnosis Date  . History of chicken pox   . Abnormal Pap smear   . Anxiety   . AMA (advanced maternal age) multigravida 35+    Past Surgical History  Procedure Date  . Breast surgery 2003    aug  . Mouth surgery     36 y.o.   Family History: family history includes Cancer in her maternal aunt and maternal grandmother; Heart disease in her father and paternal grandmother; and Hypertension in her father. Social History:  reports that she quit smoking about 12 years ago. She has never used smokeless tobacco. She reports that she drinks alcohol. She reports that she does not use illicit drugs.  ROS  Dilation: 7.5 Effacement (%): 90 Station: -1 Exam by:: Cagna,RN Blood pressure 126/82, pulse 85, temperature 97.2 F (36.2 C), temperature source Oral, resp. rate 18, height 5\' 6"  (1.676 m), weight 81.251 kg (179 lb 2 oz), last menstrual period 05/14/2011. Maternal Exam:  Uterine Assessment: Contraction strength is firm.  Contraction frequency is regular.   Abdomen: Patient reports no abdominal tenderness. Fundal height is c/w dates.   Estimated fetal weight is 7.5.   Fetal presentation: vertex  Pelvis: adequate for delivery.   Cervix: Cervix evaluated by digital exam.     Fetal Exam Fetal Monitor Review: Variability: moderate (6-25 bpm).   Pattern: accelerations present.    Fetal State Assessment: Category I - tracings are normal.     Physical Exam  Prenatal labs: ABO, Rh:  O/Positive/-- (10/08 0000) Antibody:   Rubella: Immune (10/08 0000) RPR: Nonreactive (10/08 0000)  HBsAg: Negative (10/08 0000)  HIV: Non-reactive (10/08 0000)  GBS: Positive (10/08 0000)   Assessment/Plan: IUP at term with SOL. Positive GBS  Vancomycin  Routine L and  D   Atha Muradyan S 02/15/2012, 6:51 AM

## 2012-02-15 NOTE — MAU Note (Signed)
Patient is in for labor eval. She states that she is having ctx q12m for an hour. Reports good fetal movement. Denies vaginal bleeding or lof. She states that she was told by dr Rana Snare on Tuesday that she was 2-3cm/60 effaced.

## 2012-02-15 NOTE — Progress Notes (Signed)
Pt up to Br 

## 2012-02-16 LAB — CBC
HCT: 24.3 % — ABNORMAL LOW (ref 36.0–46.0)
Hemoglobin: 7.7 g/dL — ABNORMAL LOW (ref 12.0–15.0)
MCHC: 31.7 g/dL (ref 30.0–36.0)
MCV: 87.4 fL (ref 78.0–100.0)

## 2012-02-16 MED ORDER — FERROUS SULFATE 325 (65 FE) MG PO TABS
325.0000 mg | ORAL_TABLET | Freq: Two times a day (BID) | ORAL | Status: DC
  Administered 2012-02-16 – 2012-02-17 (×3): 325 mg via ORAL
  Filled 2012-02-16 (×3): qty 1

## 2012-02-16 NOTE — Progress Notes (Signed)
Post Partum Day one  Subjective: no complaints  Did have bleeding post postpartum required methergine  Objective: Blood pressure 95/61, pulse 64, temperature 97.7 F (36.5 C), temperature source Oral, resp. rate 18, height 5\' 6"  (1.676 m), weight 179 lb 2 oz (81.251 kg), last menstrual period 05/14/2011, SpO2 98.00%, unknown if currently breastfeeding.  Physical Exam:  General: alert Lochia: appropriate Uterine Fundus: firm Incision: clear DVT Evaluation: No evidence of DVT seen on physical exam.   Basename 02/16/12 0500 02/15/12 0536  HGB 7.7* 11.2*  HCT 24.3* 35.1*    Assessment/Plan: Plan for discharge tomorrow  feso4 recheck hgb in am   LOS: 1 day   Kimla Furth S 02/16/2012, 9:14 AM

## 2012-02-17 ENCOUNTER — Inpatient Hospital Stay (HOSPITAL_COMMUNITY): Admission: RE | Admit: 2012-02-17 | Source: Ambulatory Visit

## 2012-02-17 LAB — CBC
Hemoglobin: 7.6 g/dL — ABNORMAL LOW (ref 12.0–15.0)
MCH: 27.9 pg (ref 26.0–34.0)
MCV: 87.9 fL (ref 78.0–100.0)
Platelets: 171 10*3/uL (ref 150–400)
RBC: 2.72 MIL/uL — ABNORMAL LOW (ref 3.87–5.11)

## 2012-02-17 MED ORDER — OXYCODONE-ACETAMINOPHEN 5-325 MG PO TABS: 1.0000 | ORAL_TABLET | ORAL | Status: AC | PRN

## 2012-02-17 MED ORDER — FERROUS SULFATE 325 (65 FE) MG PO TABS: 325.0000 mg | ORAL_TABLET | Freq: Two times a day (BID) | ORAL | Status: DC

## 2012-02-17 MED ORDER — METHYLPREDNISOLONE 4 MG PO KIT: PACK | ORAL | Status: AC

## 2012-02-17 NOTE — Progress Notes (Signed)
Post Partum Day 2 Subjective: no complaints, up ad lib, voiding, tolerating PO and + flatus  Objective: Blood pressure 91/59, pulse 83, temperature 98 F (36.7 C), temperature source Oral, resp. rate 18, height 5\' 6"  (1.676 m), weight 81.251 kg (179 lb 2 oz), last menstrual period 05/14/2011, SpO2 98.00%, unknown if currently breastfeeding.  Physical Exam:  General: alert, cooperative and appears stated age Lochia: appropriate Uterine Fundus: firm Incision: healing well DVT Evaluation: No evidence of DVT seen on physical exam.   Basename 02/17/12 0535 02/16/12 0500  HGB 7.6* 7.7*  HCT 23.9* 24.3*    Assessment/Plan: Discharge home Rx iron, percocet and medrol dose pack per derm for allergic reaction.   LOS: 2 days   Zell Doucette L 02/17/2012, 7:45 AM

## 2012-02-17 NOTE — Discharge Summary (Signed)
Obstetric Discharge Summary Reason for Admission: onset of labor Prenatal Procedures: none Intrapartum Procedures: spontaneous vaginal delivery Postpartum Procedures: none Complications-Operative and Postpartum: none Hemoglobin  Date Value Range Status  02/17/2012 7.6* 12.0-15.0 (g/dL) Final     HCT  Date Value Range Status  02/17/2012 23.9* 36.0-46.0 (%) Final    Physical Exam:  General: alert, cooperative and appears stated age 36: appropriate Uterine Fundus: firm Incision: healing well, no significant drainage, no dehiscence, no significant erythema DVT Evaluation: No evidence of DVT seen on physical exam.  Discharge Diagnoses: Term Pregnancy-delivered  Discharge Information: Date: 02/17/2012 Activity: pelvic rest Diet: routine Medications: Iron, Percocet and Medrol dose pack Condition: improved Instructions: refer to practice specific booklet Discharge to: home   Newborn Data: Live born female  Birth Weight: 7 lb 10.9 oz (3485 g) APGAR: 8, 9  Home with mother.  Senya Hinzman L 02/17/2012, 7:46 AM

## 2013-05-04 ENCOUNTER — Ambulatory Visit: Admitting: Family

## 2013-05-10 ENCOUNTER — Ambulatory Visit: Admitting: Family

## 2014-08-15 ENCOUNTER — Encounter (HOSPITAL_COMMUNITY): Payer: Self-pay

## 2015-04-28 ENCOUNTER — Inpatient Hospital Stay (HOSPITAL_COMMUNITY)
Admission: AD | Admit: 2015-04-28 | Discharge: 2015-04-28 | Disposition: A | Discharge status: Home / Self Care | Payer: BLUE CROSS/BLUE SHIELD | Source: Ambulatory Visit | Attending: Obstetrics and Gynecology | Admitting: Obstetrics and Gynecology

## 2015-04-28 ENCOUNTER — Encounter (HOSPITAL_COMMUNITY): Payer: Self-pay | Admitting: *Deleted

## 2015-04-28 ENCOUNTER — Inpatient Hospital Stay (HOSPITAL_COMMUNITY): Payer: BLUE CROSS/BLUE SHIELD

## 2015-04-28 DIAGNOSIS — Z3A01 Less than 8 weeks gestation of pregnancy: Secondary | ICD-10-CM | POA: Diagnosis not present

## 2015-04-28 DIAGNOSIS — O200 Threatened abortion: Secondary | ICD-10-CM

## 2015-04-28 DIAGNOSIS — O209 Hemorrhage in early pregnancy, unspecified: Secondary | ICD-10-CM

## 2015-04-28 DIAGNOSIS — Z87891 Personal history of nicotine dependence: Secondary | ICD-10-CM | POA: Diagnosis not present

## 2015-04-28 LAB — URINALYSIS, ROUTINE W REFLEX MICROSCOPIC
BILIRUBIN URINE: NEGATIVE
GLUCOSE, UA: NEGATIVE mg/dL
KETONES UR: NEGATIVE mg/dL
Leukocytes, UA: NEGATIVE
NITRITE: NEGATIVE
Protein, ur: NEGATIVE mg/dL
Specific Gravity, Urine: 1.025 (ref 1.005–1.030)
UROBILINOGEN UA: 0.2 mg/dL (ref 0.0–1.0)
pH: 5.5 (ref 5.0–8.0)

## 2015-04-28 LAB — CBC WITH DIFFERENTIAL/PLATELET
Basophils Absolute: 0 10*3/uL (ref 0.0–0.1)
Basophils Relative: 0 % (ref 0–1)
EOS ABS: 0 10*3/uL (ref 0.0–0.7)
Eosinophils Relative: 1 % (ref 0–5)
HEMATOCRIT: 34.3 % — AB (ref 36.0–46.0)
Hemoglobin: 11.6 g/dL — ABNORMAL LOW (ref 12.0–15.0)
LYMPHS PCT: 23 % (ref 12–46)
Lymphs Abs: 1.7 10*3/uL (ref 0.7–4.0)
MCH: 29.8 pg (ref 26.0–34.0)
MCHC: 33.8 g/dL (ref 30.0–36.0)
MCV: 88.2 fL (ref 78.0–100.0)
MONO ABS: 0.5 10*3/uL (ref 0.1–1.0)
MONOS PCT: 7 % (ref 3–12)
NEUTROS ABS: 5.2 10*3/uL (ref 1.7–7.7)
NEUTROS PCT: 70 % (ref 43–77)
Platelets: 205 10*3/uL (ref 150–400)
RBC: 3.89 MIL/uL (ref 3.87–5.11)
RDW: 13 % (ref 11.5–15.5)
WBC: 7.4 10*3/uL (ref 4.0–10.5)

## 2015-04-28 LAB — POCT PREGNANCY, URINE: PREG TEST UR: POSITIVE — AB

## 2015-04-28 LAB — URINE MICROSCOPIC-ADD ON

## 2015-04-28 LAB — HCG, QUANTITATIVE, PREGNANCY: hCG, Beta Chain, Quant, S: 9237 m[IU]/mL — ABNORMAL HIGH (ref <5)

## 2015-04-28 MED ORDER — PRENATAL VITAMINS PLUS 27-1 MG PO TABS: 1.0000 | ORAL_TABLET | Freq: Every day | ORAL | Status: AC

## 2015-04-28 NOTE — Discharge Instructions (Signed)
Vaginal Bleeding During Pregnancy, First Trimester  A small amount of bleeding (spotting) from the vagina is relatively common in early pregnancy. It usually stops on its own. Various things may cause bleeding or spotting in early pregnancy. Some bleeding may be related to the pregnancy, and some may not. In most cases, the bleeding is normal and is not a problem. However, bleeding can also be a sign of something serious. Be sure to tell your health care provider about any vaginal bleeding right away.  Some possible causes of vaginal bleeding during the first trimester include:  · Infection or inflammation of the cervix.  · Growths (polyps) on the cervix.  · Miscarriage or threatened miscarriage.  · Pregnancy tissue has developed outside of the uterus and in a fallopian tube (tubal pregnancy).  · Tiny cysts have developed in the uterus instead of pregnancy tissue (molar pregnancy).  HOME CARE INSTRUCTIONS   Watch your condition for any changes. The following actions may help to lessen any discomfort you are feeling:  · Follow your health care provider's instructions for limiting your activity. If your health care provider orders bed rest, you may need to stay in bed and only get up to use the bathroom. However, your health care provider may allow you to continue light activity.  · If needed, make plans for someone to help with your regular activities and responsibilities while you are on bed rest.  · Keep track of the number of pads you use each day, how often you change pads, and how soaked (saturated) they are. Write this down.  · Do not use tampons. Do not douche.  · Do not have sexual intercourse or orgasms until approved by your health care provider.  · If you pass any tissue from your vagina, save the tissue so you can show it to your health care provider.  · Only take over-the-counter or prescription medicines as directed by your health care provider.  · Do not take aspirin because it can make you  bleed.  · Keep all follow-up appointments as directed by your health care provider.  SEEK MEDICAL CARE IF:  · You have any vaginal bleeding during any part of your pregnancy.  · You have cramps or labor pains.  · You have a fever, not controlled by medicine.  SEEK IMMEDIATE MEDICAL CARE IF:   · You have severe cramps in your back or belly (abdomen).  · You pass large clots or tissue from your vagina.  · Your bleeding increases.  · You feel light-headed or weak, or you have fainting episodes.  · You have chills.  · You are leaking fluid or have a gush of fluid from your vagina.  · You pass out while having a bowel movement.  MAKE SURE YOU:  · Understand these instructions.  · Will watch your condition.  · Will get help right away if you are not doing well or get worse.  Document Released: 07/10/2005 Document Revised: 10/05/2013 Document Reviewed: 06/07/2013  ExitCare® Patient Information ©2015 [employer]. This information is not intended to replace advice given to you by your health care provider. Make sure you discuss any questions you have with your health care provider.

## 2015-04-28 NOTE — MAU Provider Note (Signed)
History     CSN: [id number]  Arrival date and time: 04/28/15 1403   First Provider Initiated Contact with Patient 04/28/15 1443      Chief Complaint  Patient presents with  . Vaginal Bleeding   HPI Frances Todd 39 y.o. [id number] @[redacted]w[redacted]d  presents to MAU reporting that she had labs in clinic today suggesting she needs workup for Ectopic pregnancy.  She reports her HCG levels were roughly 8000 on 7/13 and today.  She has been having vaginal spotting off and on for over a week.  She has mild lower mid-abdomen pain that also comes and goes.  She denies fever, weakness, syncope, HA, dysuria.   OB History    Gravida Para Term Preterm AB TAB SAB Ectopic Multiple Living   7 4 3 1 2 1 1   3       Past Medical History  Diagnosis Date  . History of chicken pox   . Abnormal Pap smear   . Anxiety   . AMA (advanced maternal age) multigravida 35+     Past Surgical History  Procedure Laterality Date  . Breast surgery  2003    aug  . Mouth surgery      39 y.o.    Family History  Problem Relation Age of Onset  . Heart disease Father   . Hypertension Father   . Cancer Maternal Aunt     breast  . Cancer Maternal Grandmother     breast  . Heart disease Paternal Grandmother     History  Substance Use Topics  . Smoking status: Former Smoker    Quit date: 02/14/2000  . Smokeless tobacco: Never Used  . Alcohol Use: Yes     Comment: occ    Allergies:  Allergies  Allergen Reactions  . Azithromycin Hives  . Penicillins Hives    Prescriptions prior to admission  Medication Sig Dispense Refill Last Dose  . ferrous sulfate 325 (65 FE) MG tablet Take 1 tablet (325 mg total) by mouth 2 (two) times daily with a meal. 60 tablet 0     ROS Pertinent ROS in HPI.  All other systems are negative.   Physical Exam   Blood pressure 117/74, pulse 93, temperature 98.6 F (37 C), temperature source Oral, resp. rate 16, height 5\' 6"  (1.676 m), weight 132 lb (59.875 kg), last menstrual period  03/05/2015, unknown if currently breastfeeding.  Physical Exam  Constitutional: She is oriented to person, place, and time. She appears well-developed and well-nourished. No distress.  HENT:  Head: Normocephalic and atraumatic.  Eyes: EOM are normal.  Neck: Normal range of motion.  Cardiovascular: Normal rate, regular rhythm and normal heart sounds.   Respiratory: Effort normal and breath sounds normal. No respiratory distress.  GI: Soft. Bowel sounds are normal. She exhibits no distension. There is no tenderness. There is no rebound and no guarding.  Musculoskeletal: Normal range of motion.  Neurological: She is alert and oriented to person, place, and time.  Skin: Skin is warm and dry.  Psychiatric: She has a normal mood and affect.   Results for orders placed or performed during the hospital encounter of 04/28/15 (from the past 24 hour(s))  Urinalysis, Routine w reflex microscopic (not at [facility])     Status: Abnormal   Collection Time: 04/28/15  2:34 PM  Result Value Ref Range   Color, Urine YELLOW YELLOW   APPearance CLEAR CLEAR   Specific Gravity, Urine 1.025 1.005 - 1.030   pH 5.5 5.0 -  8.0   Glucose, UA NEGATIVE NEGATIVE mg/dL   Hgb urine dipstick SMALL (A) NEGATIVE   Bilirubin Urine NEGATIVE NEGATIVE   Ketones, ur NEGATIVE NEGATIVE mg/dL   Protein, ur NEGATIVE NEGATIVE mg/dL   Urobilinogen, UA 0.2 0.0 - 1.0 mg/dL   Nitrite NEGATIVE NEGATIVE   Leukocytes, UA NEGATIVE NEGATIVE  Urine microscopic-add on     Status: Abnormal   Collection Time: 04/28/15  2:34 PM  Result Value Ref Range   Squamous Epithelial / LPF RARE RARE   RBC / HPF 3-6 <3 RBC/hpf   Bacteria, UA FEW (A) RARE   Urine-Other MUCOUS PRESENT   Pregnancy, urine POC     Status: Abnormal   Collection Time: 04/28/15  3:06 PM  Result Value Ref Range   Preg Test, Ur POSITIVE (A) NEGATIVE  hCG, quantitative, pregnancy     Status: Abnormal   Collection Time: 04/28/15  3:28 PM  Result Value Ref Range   hCG, Beta  Chain, Quant, S 9237 (H) <5 mIU/mL  CBC with Differential/Platelet     Status: Abnormal   Collection Time: 04/28/15  3:29 PM  Result Value Ref Range   WBC 7.4 4.0 - 10.5 K/uL   RBC 3.89 3.87 - 5.11 MIL/uL   Hemoglobin 11.6 (L) 12.0 - 15.0 g/dL   HCT 16.134.3 (L) 09.636.0 - 04.546.0 %   MCV 88.2 78.0 - 100.0 fL   MCH 29.8 26.0 - 34.0 pg   MCHC 33.8 30.0 - 36.0 g/dL   RDW 40.913.0 81.111.5 - 91.415.5 %   Platelets 205 150 - 400 K/uL   Neutrophils Relative % 70 43 - 77 %   Neutro Abs 5.2 1.7 - 7.7 K/uL   Lymphocytes Relative 23 12 - 46 %   Lymphs Abs 1.7 0.7 - 4.0 K/uL   Monocytes Relative 7 3 - 12 %   Monocytes Absolute 0.5 0.1 - 1.0 K/uL   Eosinophils Relative 1 0 - 5 %   Eosinophils Absolute 0.0 0.0 - 0.7 K/uL   Basophils Relative 0 0 - 1 %   Basophils Absolute 0.0 0.0 - 0.1 K/uL   [place] Ob Comp Less 14 Wks  04/28/2015   CLINICAL DATA:  Pregnant, bleeding  EXAM: OBSTETRIC <14 WK US AND TRANSVAGINAL OB US  TECHNIQUE: Both transabdominal and transvaginal ultrasound examinations were performed for complete evaluation of the gestation as well as the maternal uterus, adnexal regions, and pelvic cul-de-sac. Transvaginal technique was performed to assess early pregnancy.  COMPARISON:  None.  FINDINGS: Intrauterine gestational sac: Visualized/normal in shape.  Yolk sac:  Present  Embryo:  Present  Cardiac Activity: Present  Heart Rate: 64  bpm  CRL:  2.4  mm   5 w   6 d                  US EDC: 12/23/15  Maternal uterus/adnexae: No subchronic hemorrhage.  Bilateral ovaries are within normal limits, noting a 1.6 cm left corpus luteal cyst.  Trace pelvic fluid.  IMPRESSION: Single live intrauterine gestation with estimated gestational age [redacted] weeks 6 days by crown-rump length.  Fetal bradycardia.   Electronically Signed   By: Charline BillsSriyesh  Krishnan M.D.   On: 04/28/2015 16:28   [place] Ob Transvaginal  04/28/2015   CLINICAL DATA:  Pregnant, bleeding  EXAM: OBSTETRIC <14 WK US AND TRANSVAGINAL OB US  TECHNIQUE: Both transabdominal and  transvaginal ultrasound examinations were performed for complete evaluation of the gestation as well as the maternal uterus, adnexal regions, and  pelvic cul-de-sac. Transvaginal technique was performed to assess early pregnancy.  COMPARISON:  None.  FINDINGS: Intrauterine gestational sac: Visualized/normal in shape.  Yolk sac:  Present  Embryo:  Present  Cardiac Activity: Present  Heart Rate: 64  bpm  CRL:  2.4  mm   5 w   6 d                  [place] EDC: 12/23/15  Maternal uterus/adnexae: No subchronic hemorrhage.  Bilateral ovaries are within normal limits, noting a 1.6 cm left corpus luteal cyst.  Trace pelvic fluid.  IMPRESSION: Single live intrauterine gestation with estimated gestational age [redacted] weeks 6 days by crown-rump length.  Fetal bradycardia.   Electronically Signed   By: Charline Bills M.D.   On: 04/28/2015 16:28   MAU Course  Procedures  MDM Labs and U/S ordered to eval for possible ectopic pregnancy.  U/S shows IUP with embryo and cardiac activity.   Discussed with Dr. Marcelle Overlie whom advises for pt discharge with no further workup required.   Follow up early next week in office  Assessment and Plan  A:  1. Threatened miscarriage in early pregnancy   2. Vaginal bleeding in pregnancy, first trimester    P: Discharge to home PNV qd Follow up in office- call on Monday.   D/C V[place]vanse Pelvic Rest Patient may return to MAU as needed or if her condition were to change or worsen                                                                           Bertram Denver 04/28/2015, 2:44 PM

## 2015-04-28 NOTE — MAU Note (Signed)
Patient presents at [redacted] weeks gestation and sent by office for U/S and evaluation of ectopic pregnancy. States blood levels are decreasing. Denies discharge but has noted scant vaginal bleeding.

## 2015-05-23 ENCOUNTER — Other Ambulatory Visit: Payer: Self-pay | Admitting: Obstetrics and Gynecology

## 2015-05-24 LAB — CYTOLOGY - PAP

## 2016-03-02 ENCOUNTER — Encounter (HOSPITAL_COMMUNITY): Payer: Self-pay | Admitting: *Deleted

## 2016-06-02 IMAGING — US US OB COMP LESS 14 WK
1 series · 14 of 28 positions shown · non-contrast
Comparison: None.

CLINICAL DATA: Pregnant, bleeding

EXAM:
OBSTETRIC <14 WK US AND TRANSVAGINAL OB US
TECHNIQUE: Both transabdominal and transvaginal ultrasound examinations were
performed for complete evaluation of the gestation as well as the
maternal uterus, adnexal regions, and pelvic cul-de-sac.
Transvaginal technique was performed to assess early pregnancy.

[Series 1: us ob comp less 14 wk · 14 of 56 slices shown]
[im 3/56]
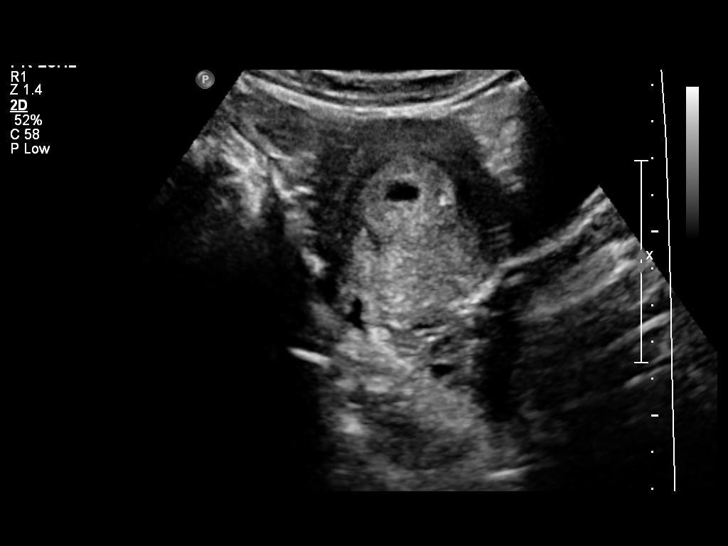
[im 7/56]
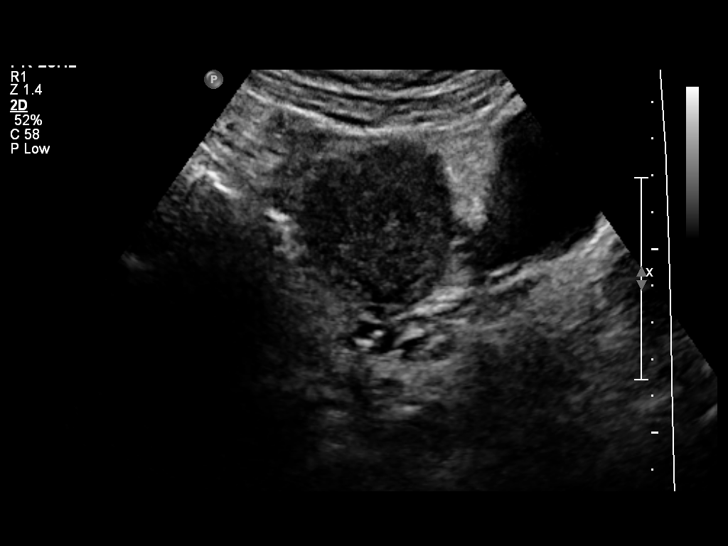
[im 11/56]
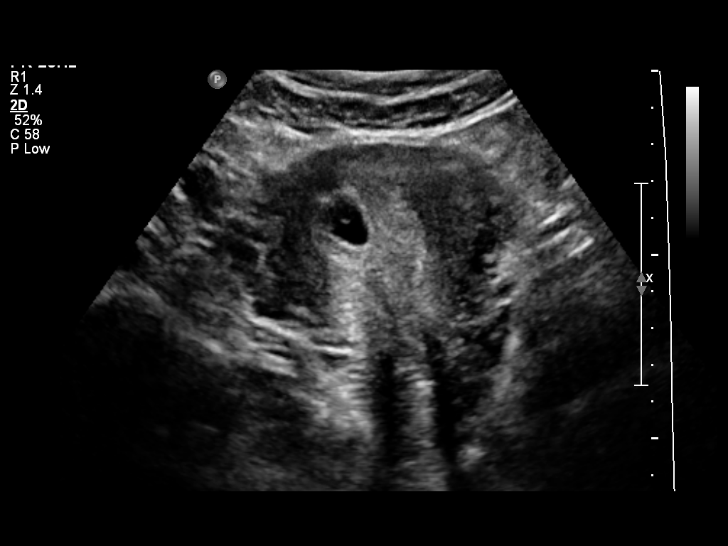
[im 15/56]
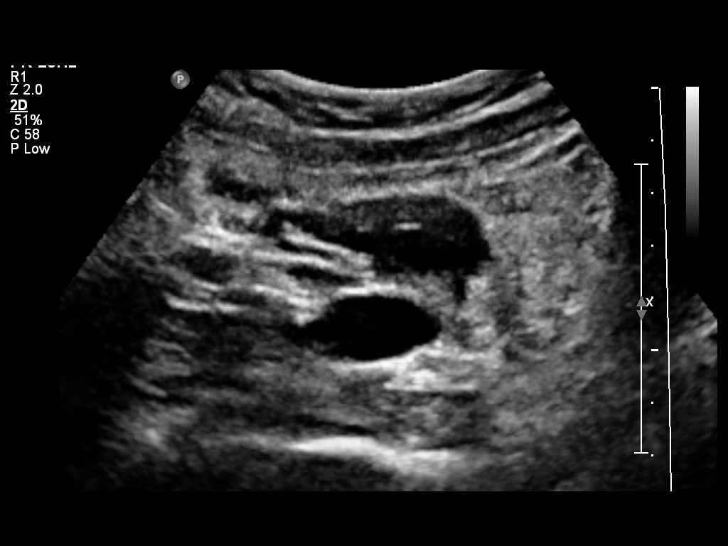
[im 19/56]
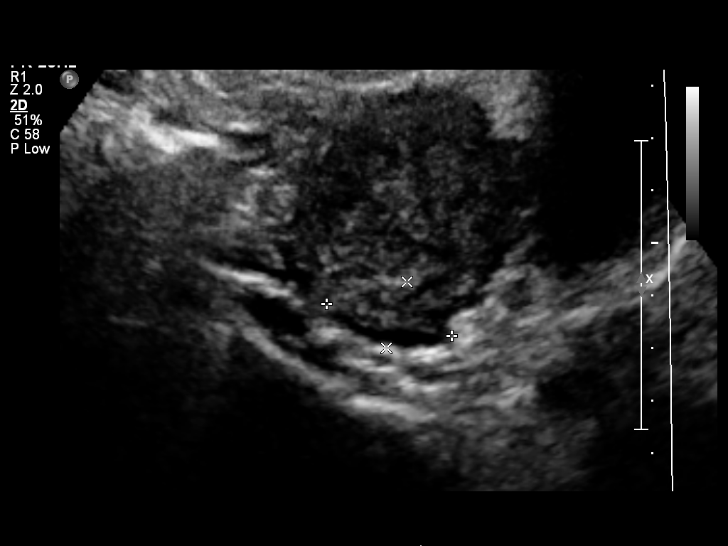
[im 23/56]
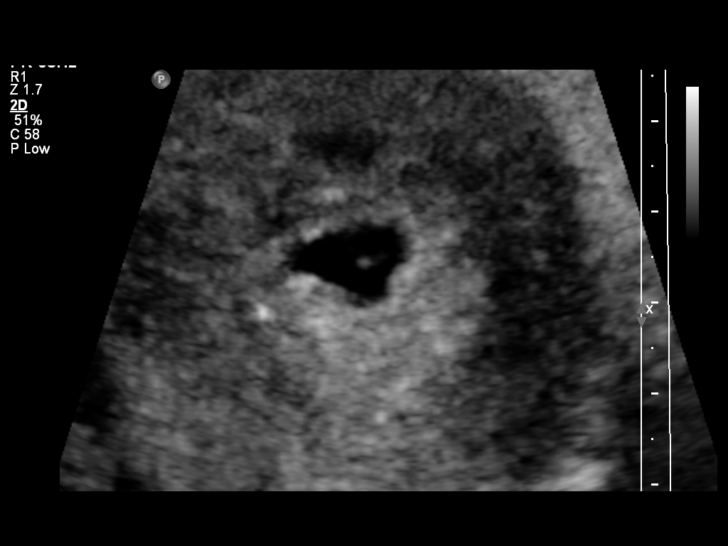
[im 27/56]
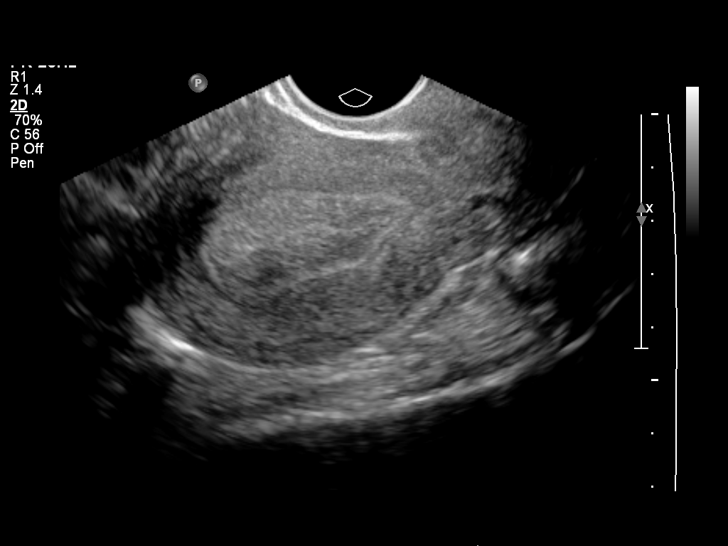
[im 31/56]
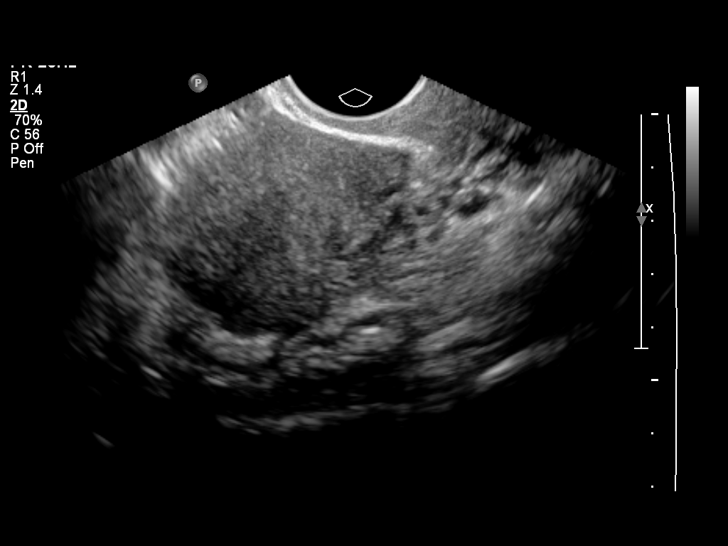
[im 35/56]
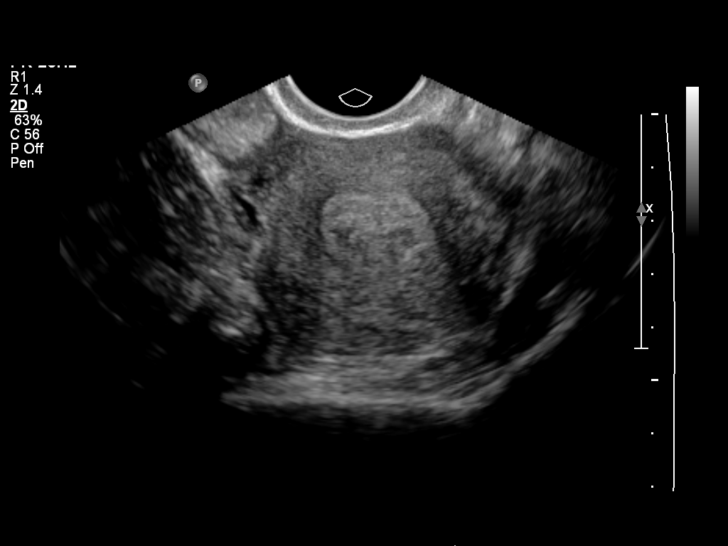
[im 39/56]
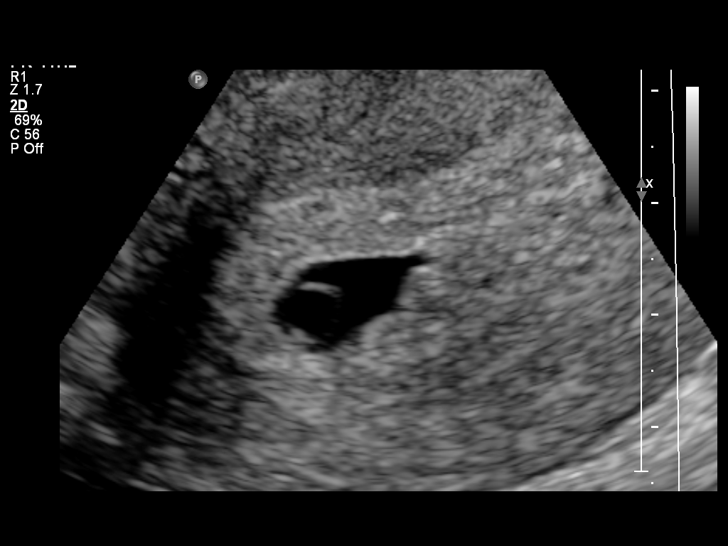
[im 43/56]
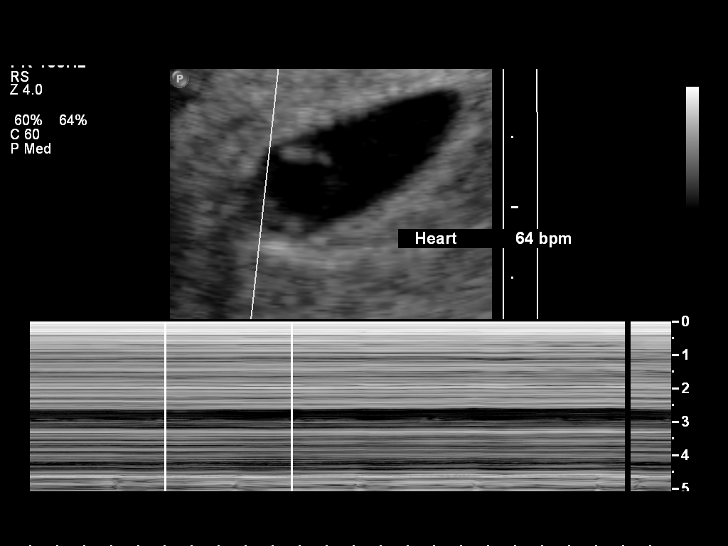
[im 47/56]
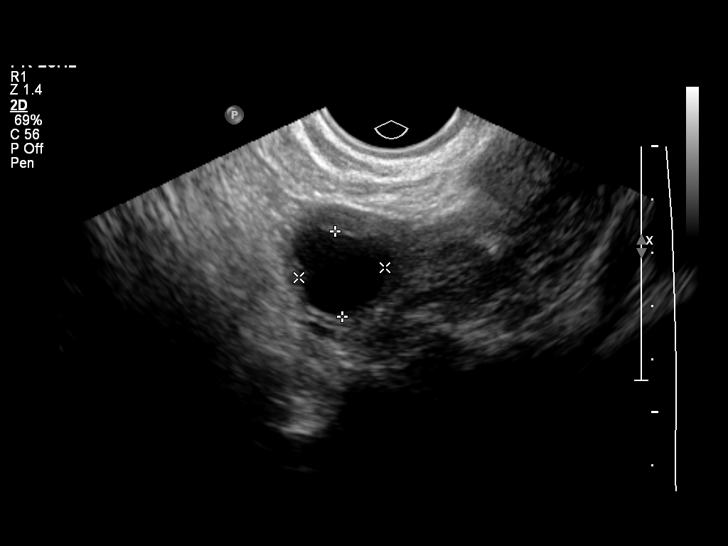
[im 51/56]
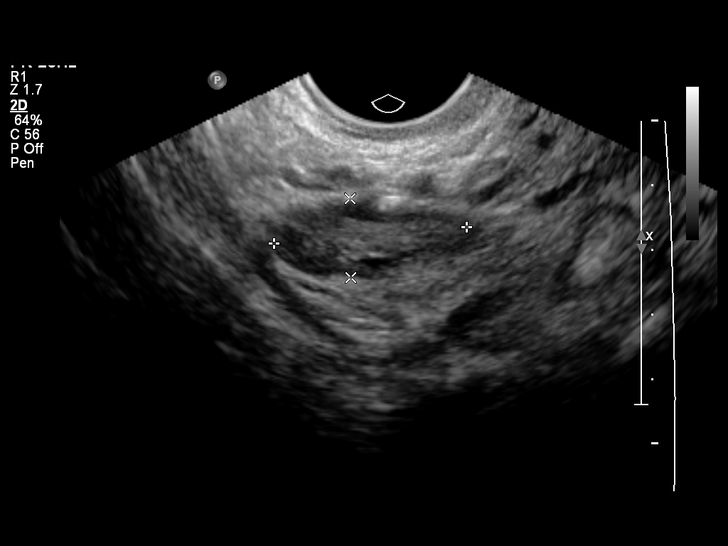
[im 56/56]
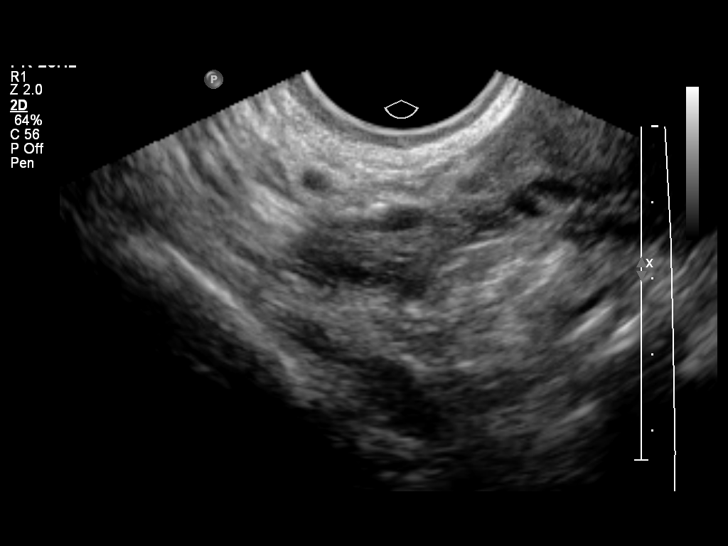

[14 of 28 positions shown; findings below may reference images not displayed]

FINDINGS: Intrauterine gestational sac: Visualized/normal in shape.

Yolk sac:  Present

Embryo:  Present

Cardiac Activity: Present

Heart Rate: 64  bpm

CRL:  2.4  mm   5 w   6 d                  US EDC: 12/23/15

Maternal uterus/adnexae: No subchronic hemorrhage.

Bilateral ovaries are within normal limits, noting a 1.6 cm left
corpus luteal cyst.

Trace pelvic fluid.
IMPRESSION: Single live intrauterine gestation with estimated gestational age 5
weeks 6 days by crown-rump length.

Fetal bradycardia.
